# Patient Record
Sex: Male | Born: 1975 | Race: White | Hispanic: No | Marital: Married | Smoking: Former smoker
Health system: Southern US, Community
[De-identification: ages and names within clinical notes are randomized; demographics above are authoritative.]

## PROBLEM LIST (undated history)

## (undated) DIAGNOSIS — F419 Anxiety disorder, unspecified: Secondary | ICD-10-CM

## (undated) DIAGNOSIS — R112 Nausea with vomiting, unspecified: Secondary | ICD-10-CM

## (undated) DIAGNOSIS — N200 Calculus of kidney: Secondary | ICD-10-CM

## (undated) DIAGNOSIS — Z972 Presence of dental prosthetic device (complete) (partial): Secondary | ICD-10-CM

## (undated) DIAGNOSIS — E7800 Pure hypercholesterolemia, unspecified: Secondary | ICD-10-CM

## (undated) DIAGNOSIS — Z9889 Other specified postprocedural states: Secondary | ICD-10-CM

## (undated) DIAGNOSIS — I2510 Atherosclerotic heart disease of native coronary artery without angina pectoris: Secondary | ICD-10-CM

## (undated) DIAGNOSIS — R29898 Other symptoms and signs involving the musculoskeletal system: Secondary | ICD-10-CM

## (undated) DIAGNOSIS — Z9689 Presence of other specified functional implants: Secondary | ICD-10-CM

## (undated) DIAGNOSIS — G8929 Other chronic pain: Secondary | ICD-10-CM

## (undated) DIAGNOSIS — K219 Gastro-esophageal reflux disease without esophagitis: Secondary | ICD-10-CM

## (undated) DIAGNOSIS — B3781 Candidal esophagitis: Secondary | ICD-10-CM

## (undated) DIAGNOSIS — F32A Depression, unspecified: Secondary | ICD-10-CM

## (undated) DIAGNOSIS — G4730 Sleep apnea, unspecified: Secondary | ICD-10-CM

## (undated) DIAGNOSIS — J449 Chronic obstructive pulmonary disease, unspecified: Secondary | ICD-10-CM

## (undated) DIAGNOSIS — Z87442 Personal history of urinary calculi: Secondary | ICD-10-CM

## (undated) DIAGNOSIS — I219 Acute myocardial infarction, unspecified: Secondary | ICD-10-CM

## (undated) DIAGNOSIS — M1990 Unspecified osteoarthritis, unspecified site: Secondary | ICD-10-CM

## (undated) DIAGNOSIS — M778 Other enthesopathies, not elsewhere classified: Secondary | ICD-10-CM

## (undated) DIAGNOSIS — S63642A Sprain of metacarpophalangeal joint of left thumb, initial encounter: Secondary | ICD-10-CM

## (undated) DIAGNOSIS — F329 Major depressive disorder, single episode, unspecified: Secondary | ICD-10-CM

## (undated) DIAGNOSIS — R51 Headache: Secondary | ICD-10-CM

## (undated) DIAGNOSIS — F4310 Post-traumatic stress disorder, unspecified: Secondary | ICD-10-CM

## (undated) DIAGNOSIS — R519 Headache, unspecified: Secondary | ICD-10-CM

## (undated) HISTORY — PX: OTHER SURGICAL HISTORY: SHX169

## (undated) HISTORY — PX: CORONARY ANGIOPLASTY: SHX604

## (undated) HISTORY — PX: BACK SURGERY: SHX140

## (undated) HISTORY — PX: SPINAL CORD STIMULATOR IMPLANT: SHX2422

## (undated) HISTORY — DX: Calculus of kidney: N20.0

---

## 1996-06-04 HISTORY — PX: CYST EXCISION: SHX5701

## 1998-10-04 ENCOUNTER — Encounter: Payer: Self-pay | Admitting: Neurosurgery

## 1998-10-04 ENCOUNTER — Ambulatory Visit (HOSPITAL_COMMUNITY): Admission: RE | Admit: 1998-10-04 | Discharge: 1998-10-05 | Payer: Self-pay | Admitting: Neurosurgery

## 2000-01-08 ENCOUNTER — Encounter: Payer: Self-pay | Admitting: Neurosurgery

## 2000-01-08 ENCOUNTER — Encounter: Admission: RE | Admit: 2000-01-08 | Discharge: 2000-01-08 | Payer: Self-pay | Admitting: Neurosurgery

## 2000-06-04 DIAGNOSIS — Z87442 Personal history of urinary calculi: Secondary | ICD-10-CM

## 2000-06-04 HISTORY — DX: Personal history of urinary calculi: Z87.442

## 2001-06-04 DIAGNOSIS — Z9889 Other specified postprocedural states: Secondary | ICD-10-CM

## 2001-06-04 DIAGNOSIS — R112 Nausea with vomiting, unspecified: Secondary | ICD-10-CM

## 2001-06-04 HISTORY — DX: Other specified postprocedural states: R11.2

## 2001-06-04 HISTORY — DX: Other specified postprocedural states: Z98.890

## 2001-12-15 ENCOUNTER — Encounter: Admission: RE | Admit: 2001-12-15 | Discharge: 2001-12-15 | Payer: Self-pay | Admitting: Neurosurgery

## 2001-12-15 ENCOUNTER — Encounter: Payer: Self-pay | Admitting: Neurosurgery

## 2001-12-29 ENCOUNTER — Encounter: Payer: Self-pay | Admitting: Neurosurgery

## 2001-12-29 ENCOUNTER — Encounter: Admission: RE | Admit: 2001-12-29 | Discharge: 2001-12-29 | Payer: Self-pay | Admitting: Neurosurgery

## 2002-01-12 ENCOUNTER — Encounter: Admission: RE | Admit: 2002-01-12 | Discharge: 2002-01-12 | Payer: Self-pay | Admitting: Neurosurgery

## 2002-01-12 ENCOUNTER — Encounter: Payer: Self-pay | Admitting: Neurosurgery

## 2002-02-12 ENCOUNTER — Encounter: Payer: Self-pay | Admitting: Neurosurgery

## 2002-02-12 ENCOUNTER — Inpatient Hospital Stay (HOSPITAL_COMMUNITY): Admission: RE | Admit: 2002-02-12 | Discharge: 2002-02-16 | Payer: Self-pay | Admitting: Neurosurgery

## 2003-06-05 HISTORY — PX: LUMBAR NERVE STIMLATOR INSERTION: SHX1983

## 2003-08-04 ENCOUNTER — Ambulatory Visit (HOSPITAL_COMMUNITY): Admission: RE | Admit: 2003-08-04 | Discharge: 2003-08-04 | Payer: Self-pay | Admitting: Neurosurgery

## 2006-01-16 DIAGNOSIS — F332 Major depressive disorder, recurrent severe without psychotic features: Secondary | ICD-10-CM | POA: Insufficient documentation

## 2006-01-16 DIAGNOSIS — F321 Major depressive disorder, single episode, moderate: Secondary | ICD-10-CM

## 2006-01-16 HISTORY — DX: Major depressive disorder, single episode, moderate: F32.1

## 2006-07-01 DIAGNOSIS — F4310 Post-traumatic stress disorder, unspecified: Secondary | ICD-10-CM | POA: Insufficient documentation

## 2006-10-23 ENCOUNTER — Ambulatory Visit: Payer: Self-pay | Admitting: Pain Medicine

## 2006-10-30 ENCOUNTER — Ambulatory Visit: Payer: Self-pay | Admitting: Pain Medicine

## 2006-12-13 ENCOUNTER — Ambulatory Visit: Payer: Self-pay | Admitting: Unknown Physician Specialty

## 2008-03-20 ENCOUNTER — Ambulatory Visit: Payer: Self-pay | Admitting: Internal Medicine

## 2008-06-04 HISTORY — PX: VASECTOMY: SHX75

## 2009-03-13 ENCOUNTER — Ambulatory Visit: Payer: Self-pay | Admitting: Family Medicine

## 2009-06-04 DIAGNOSIS — F4310 Post-traumatic stress disorder, unspecified: Secondary | ICD-10-CM

## 2009-06-04 HISTORY — DX: Post-traumatic stress disorder, unspecified: F43.10

## 2009-10-26 ENCOUNTER — Ambulatory Visit: Payer: Self-pay | Admitting: Nurse Practitioner

## 2010-01-08 ENCOUNTER — Ambulatory Visit: Payer: Self-pay | Admitting: Internal Medicine

## 2010-06-04 HISTORY — PX: SHOULDER ARTHROSCOPY WITH ROTATOR CUFF REPAIR: SHX5685

## 2010-06-23 ENCOUNTER — Ambulatory Visit: Payer: Self-pay | Admitting: Internal Medicine

## 2010-06-25 ENCOUNTER — Ambulatory Visit: Payer: Self-pay | Admitting: Family Medicine

## 2010-09-10 ENCOUNTER — Ambulatory Visit: Payer: Self-pay | Admitting: Family Medicine

## 2010-09-12 ENCOUNTER — Ambulatory Visit: Payer: Self-pay | Admitting: Family Medicine

## 2010-09-15 ENCOUNTER — Ambulatory Visit: Payer: Self-pay | Admitting: Family Medicine

## 2011-05-22 ENCOUNTER — Inpatient Hospital Stay: Payer: Self-pay | Admitting: Psychiatry

## 2011-07-20 DIAGNOSIS — F063 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, unspecified: Secondary | ICD-10-CM | POA: Insufficient documentation

## 2012-06-04 HISTORY — PX: CARDIAC CATHETERIZATION: SHX172

## 2013-04-04 DIAGNOSIS — I219 Acute myocardial infarction, unspecified: Secondary | ICD-10-CM

## 2013-04-04 HISTORY — DX: Acute myocardial infarction, unspecified: I21.9

## 2013-07-22 DIAGNOSIS — E7800 Pure hypercholesterolemia, unspecified: Secondary | ICD-10-CM | POA: Insufficient documentation

## 2013-07-22 DIAGNOSIS — E7849 Other hyperlipidemia: Secondary | ICD-10-CM | POA: Insufficient documentation

## 2013-11-06 DIAGNOSIS — J449 Chronic obstructive pulmonary disease, unspecified: Secondary | ICD-10-CM | POA: Insufficient documentation

## 2014-06-04 HISTORY — PX: HEMATOMA EVACUATION: SHX5118
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
## 2015-05-18 DIAGNOSIS — E7143 Iatrogenic carnitine deficiency: Secondary | ICD-10-CM | POA: Insufficient documentation

## 2015-05-18 DIAGNOSIS — T50905A Adverse effect of unspecified drugs, medicaments and biological substances, initial encounter: Secondary | ICD-10-CM | POA: Insufficient documentation

## 2017-02-14 ENCOUNTER — Other Ambulatory Visit: Payer: Self-pay | Admitting: Orthopedic Surgery

## 2017-02-14 DIAGNOSIS — M25521 Pain in right elbow: Principal | ICD-10-CM

## 2017-02-14 DIAGNOSIS — G8929 Other chronic pain: Secondary | ICD-10-CM

## 2017-02-14 DIAGNOSIS — M7711 Lateral epicondylitis, right elbow: Secondary | ICD-10-CM

## 2017-02-28 ENCOUNTER — Ambulatory Visit
Admission: RE | Admit: 2017-02-28 | Discharge: 2017-02-28 | Disposition: A | Discharge status: Home / Self Care | Payer: Medicare Other | Source: Ambulatory Visit | Attending: Orthopedic Surgery | Admitting: Orthopedic Surgery

## 2017-02-28 ENCOUNTER — Other Ambulatory Visit: Payer: Self-pay | Admitting: Orthopedic Surgery

## 2017-02-28 DIAGNOSIS — G8929 Other chronic pain: Secondary | ICD-10-CM | POA: Diagnosis present

## 2017-02-28 DIAGNOSIS — M25521 Pain in right elbow: Secondary | ICD-10-CM | POA: Diagnosis present

## 2017-02-28 DIAGNOSIS — Z9889 Other specified postprocedural states: Secondary | ICD-10-CM

## 2017-02-28 DIAGNOSIS — M7711 Lateral epicondylitis, right elbow: Secondary | ICD-10-CM | POA: Diagnosis not present

## 2017-02-28 DIAGNOSIS — M19011 Primary osteoarthritis, right shoulder: Secondary | ICD-10-CM | POA: Diagnosis not present

## 2017-03-13 DIAGNOSIS — E669 Obesity, unspecified: Secondary | ICD-10-CM | POA: Insufficient documentation

## 2017-04-23 ENCOUNTER — Other Ambulatory Visit: Payer: Self-pay

## 2017-04-23 ENCOUNTER — Encounter
Admission: RE | Admit: 2017-04-23 | Discharge: 2017-04-23 | Disposition: A | Discharge status: Home / Self Care | Payer: Medicare Other | Source: Ambulatory Visit | Attending: Unknown Physician Specialty | Admitting: Unknown Physician Specialty

## 2017-04-23 DIAGNOSIS — Z01812 Encounter for preprocedural laboratory examination: Secondary | ICD-10-CM | POA: Insufficient documentation

## 2017-04-23 DIAGNOSIS — I2510 Atherosclerotic heart disease of native coronary artery without angina pectoris: Secondary | ICD-10-CM | POA: Diagnosis not present

## 2017-04-23 DIAGNOSIS — I252 Old myocardial infarction: Secondary | ICD-10-CM | POA: Diagnosis not present

## 2017-04-23 DIAGNOSIS — Z01810 Encounter for preprocedural cardiovascular examination: Secondary | ICD-10-CM | POA: Insufficient documentation

## 2017-04-23 HISTORY — DX: Major depressive disorder, single episode, unspecified: F32.9

## 2017-04-23 HISTORY — DX: Other specified postprocedural states: Z98.890

## 2017-04-23 HISTORY — DX: Nausea with vomiting, unspecified: R11.2

## 2017-04-23 HISTORY — DX: Personal history of urinary calculi: Z87.442

## 2017-04-23 HISTORY — DX: Headache: R51

## 2017-04-23 HISTORY — DX: Post-traumatic stress disorder, unspecified: F43.10

## 2017-04-23 HISTORY — DX: Anxiety disorder, unspecified: F41.9

## 2017-04-23 HISTORY — DX: Atherosclerotic heart disease of native coronary artery without angina pectoris: I25.10

## 2017-04-23 HISTORY — DX: Chronic obstructive pulmonary disease, unspecified: J44.9

## 2017-04-23 HISTORY — DX: Acute myocardial infarction, unspecified: I21.9

## 2017-04-23 HISTORY — DX: Headache, unspecified: R51.9

## 2017-04-23 HISTORY — DX: Gastro-esophageal reflux disease without esophagitis: K21.9

## 2017-04-23 HISTORY — DX: Sleep apnea, unspecified: G47.30

## 2017-04-23 HISTORY — DX: Depression, unspecified: F32.A

## 2017-04-23 LAB — CBC
HCT: 38.7 % — ABNORMAL LOW (ref 40.0–52.0)
Hemoglobin: 13 g/dL (ref 13.0–18.0)
MCH: 29.7 pg (ref 26.0–34.0)
MCHC: 33.6 g/dL (ref 32.0–36.0)
MCV: 88.5 fL (ref 80.0–100.0)
PLATELETS: 145 10*3/uL — AB (ref 150–440)
RBC: 4.37 MIL/uL — ABNORMAL LOW (ref 4.40–5.90)
RDW: 14.1 % (ref 11.5–14.5)
WBC: 4.1 10*3/uL (ref 3.8–10.6)

## 2017-04-23 NOTE — Patient Instructions (Signed)
  Your procedure is scheduled on:Wednesday Nov. 28 , 2018. Report to Same Day Surgery. To find out your arrival time please call [phone] between 1PM - 3PM on Tuesday Nov. 27, 2018 .  Remember: Instructions that are not followed completely may result in serious medical risk, up to and including death, or upon the discretion of your surgeon and anesthesiologist your surgery may need to be rescheduled.    _x___ 1. Do not eat food after midnight night prior to surgery. No gum   chewing or hard candies, snacks or breakfast.    May drink the following: water, Gatorade, clear apple juice, black coffee     or black tea up until 2 hours prior to ARRIVAL time.     ____ 2. No Alcohol for 24 hours before or after surgery.   ____ 3. Bring all medications with you on the day of surgery if instructed.    __x__ 4. Notify your doctor if there is any change in your medical condition     (cold, fever, infections).    _____ 5.   Do Not Smoke or use e-cigarettes For 24 Hours Prior to Your   Surgery.  Do not use any chewable tobacco products for at least 6   hours prior to  surgery.                      Do not wear jewelry, make-up, hairpins, clips or nail polish.  Do not wear lotions, powders, or perfumes.   Do not shave 48 hours prior to surgery. Men may shave face and neck.  Do not bring valuables to the hospital.    [facility] is not responsible for any belongings or valuables.               Contacts, dentures or bridgework may not be worn into surgery.  Leave your suitcase in the car. After surgery it may be brought to your room.  For patients admitted to the hospital, discharge time is determined by your  treatment team.   Patients discharged the day of surgery will not be allowed to drive home.    Please read over the following fact sheets that you were given:   [facility] Preparing for Surgery  _x___ Take these medicines the morning of surgery with A SIP OF WATER:    1. buPROPion  (WELLBUTRIN XL)  2. LYRICA   3. oxyCODONE (OXY IR/ROXICODONE) optional  4. pantoprazole (PROTONIX) take an extra dose at bedtime the night prior to surgery.      ____ Fleet Enema (as directed)   __x__ Use CHG Soap as directed on instruction sheet  __x__ Use inhalers on the day of surgery and bring to hospital day of surgery  ____ Stop metformin 2 days prior to surgery    ____ Take 1/2 of usual insulin dose the night before surgery and none on the morning of          surgery.   __x__ Stop aspirin as instructed by PCP.  __x__ Stop Anti-inflammatories such as Advil, Aleve, Ibuprofen, Motrin, Naproxen,  Naprosyn, Goodies powders or aspirin products. OK to take Tylenol and oxyCODONE (OXY IR/ROXICODONE)   .   ____ Stop supplements until after surgery.    __x__ Bring Bi-Pap to the hospital.

## 2017-04-23 NOTE — Pre-Procedure Instructions (Signed)
Medical clearance on front of Pt's chart. EKG= NSR 04/23/2017.

## 2017-05-01 ENCOUNTER — Encounter
Admission: RE | Disposition: A | Discharge status: Home / Self Care | Payer: Self-pay | Source: Ambulatory Visit | Attending: Unknown Physician Specialty

## 2017-05-01 ENCOUNTER — Ambulatory Visit
Admission: RE | Admit: 2017-05-01 | Discharge: 2017-05-01 | Disposition: A | Discharge status: Home / Self Care | Payer: Medicare Other | Source: Ambulatory Visit | Attending: Unknown Physician Specialty | Admitting: Unknown Physician Specialty

## 2017-05-01 ENCOUNTER — Ambulatory Visit: Payer: Medicare Other | Admitting: Anesthesiology

## 2017-05-01 ENCOUNTER — Encounter: Payer: Self-pay | Admitting: Anesthesiology

## 2017-05-01 ENCOUNTER — Other Ambulatory Visit: Payer: Self-pay

## 2017-05-01 DIAGNOSIS — I2510 Atherosclerotic heart disease of native coronary artery without angina pectoris: Secondary | ICD-10-CM | POA: Diagnosis not present

## 2017-05-01 DIAGNOSIS — K219 Gastro-esophageal reflux disease without esophagitis: Secondary | ICD-10-CM | POA: Diagnosis not present

## 2017-05-01 DIAGNOSIS — Z955 Presence of coronary angioplasty implant and graft: Secondary | ICD-10-CM | POA: Diagnosis not present

## 2017-05-01 DIAGNOSIS — G4730 Sleep apnea, unspecified: Secondary | ICD-10-CM | POA: Diagnosis not present

## 2017-05-01 DIAGNOSIS — Z87891 Personal history of nicotine dependence: Secondary | ICD-10-CM | POA: Diagnosis not present

## 2017-05-01 DIAGNOSIS — E669 Obesity, unspecified: Secondary | ICD-10-CM | POA: Diagnosis not present

## 2017-05-01 DIAGNOSIS — I252 Old myocardial infarction: Secondary | ICD-10-CM | POA: Diagnosis not present

## 2017-05-01 DIAGNOSIS — M7711 Lateral epicondylitis, right elbow: Secondary | ICD-10-CM | POA: Insufficient documentation

## 2017-05-01 DIAGNOSIS — J449 Chronic obstructive pulmonary disease, unspecified: Secondary | ICD-10-CM | POA: Insufficient documentation

## 2017-05-01 HISTORY — PX: TENNIS ELBOW RELEASE/NIRSCHEL PROCEDURE: SHX6651

## 2017-05-01 SURGERY — TENNIS ELBOW RELEASE/NIRSCHEL PROCEDURE
Anesthesia: General | Site: Elbow | Laterality: Right | Wound class: Clean

## 2017-05-01 MED ORDER — KETOROLAC TROMETHAMINE 30 MG/ML IJ SOLN
INTRAMUSCULAR | Status: DC | PRN
  Administered 2017-05-01: 30 mg via INTRAVENOUS

## 2017-05-01 MED ORDER — KETOROLAC TROMETHAMINE 30 MG/ML IJ SOLN
INTRAMUSCULAR | Status: AC
  Filled 2017-05-01: qty 1

## 2017-05-01 MED ORDER — ONDANSETRON HCL 4 MG/2ML IJ SOLN
INTRAMUSCULAR | Status: AC
  Filled 2017-05-01: qty 2

## 2017-05-01 MED ORDER — FENTANYL CITRATE (PF) 100 MCG/2ML IJ SOLN
INTRAMUSCULAR | Status: DC | PRN
  Administered 2017-05-01 (×5): 25 ug via INTRAVENOUS
  Administered 2017-05-01: 50 ug via INTRAVENOUS
  Administered 2017-05-01: 25 ug via INTRAVENOUS

## 2017-05-01 MED ORDER — GLYCOPYRROLATE 0.2 MG/ML IJ SOLN
INTRAMUSCULAR | Status: DC | PRN
  Administered 2017-05-01: 0.2 mg via INTRAVENOUS

## 2017-05-01 MED ORDER — FENTANYL CITRATE (PF) 100 MCG/2ML IJ SOLN
25.0000 ug | INTRAMUSCULAR | Status: AC | PRN
  Administered 2017-05-01 (×6): 25 ug via INTRAVENOUS

## 2017-05-01 MED ORDER — FENTANYL CITRATE (PF) 100 MCG/2ML IJ SOLN
INTRAMUSCULAR | Status: AC
  Administered 2017-05-01: 25 ug via INTRAVENOUS
  Filled 2017-05-01: qty 2

## 2017-05-01 MED ORDER — BUPIVACAINE HCL (PF) 0.5 % IJ SOLN
INTRAMUSCULAR | Status: DC | PRN
  Administered 2017-05-01: 10 mL

## 2017-05-01 MED ORDER — DEXAMETHASONE SODIUM PHOSPHATE 10 MG/ML IJ SOLN
INTRAMUSCULAR | Status: AC
  Filled 2017-05-01: qty 1

## 2017-05-01 MED ORDER — FENTANYL CITRATE (PF) 100 MCG/2ML IJ SOLN
INTRAMUSCULAR | Status: AC
  Filled 2017-05-01: qty 2

## 2017-05-01 MED ORDER — DEXMEDETOMIDINE HCL IN NACL 80 MCG/20ML IV SOLN
INTRAVENOUS | Status: AC
  Filled 2017-05-01: qty 20

## 2017-05-01 MED ORDER — DEXMEDETOMIDINE HCL 200 MCG/2ML IV SOLN
INTRAVENOUS | Status: DC | PRN
  Administered 2017-05-01: 8 ug via INTRAVENOUS
  Administered 2017-05-01: 4 ug via INTRAVENOUS
  Administered 2017-05-01: 8 ug via INTRAVENOUS

## 2017-05-01 MED ORDER — PROPOFOL 10 MG/ML IV BOLUS
INTRAVENOUS | Status: AC
  Filled 2017-05-01: qty 20

## 2017-05-01 MED ORDER — MIDAZOLAM HCL 2 MG/2ML IJ SOLN
INTRAMUSCULAR | Status: AC
  Filled 2017-05-01: qty 2

## 2017-05-01 MED ORDER — LACTATED RINGERS IV SOLN
INTRAVENOUS | Status: DC
  Administered 2017-05-01: 09:00:00 via INTRAVENOUS

## 2017-05-01 MED ORDER — HYDROMORPHONE HCL 1 MG/ML IJ SOLN
INTRAMUSCULAR | Status: AC
  Administered 2017-05-01: 0.5 mg via INTRAVENOUS
  Filled 2017-05-01: qty 1

## 2017-05-01 MED ORDER — MIDAZOLAM HCL 2 MG/2ML IJ SOLN
INTRAMUSCULAR | Status: DC | PRN
  Administered 2017-05-01: 2 mg via INTRAVENOUS

## 2017-05-01 MED ORDER — ONDANSETRON HCL 4 MG/2ML IJ SOLN
INTRAMUSCULAR | Status: DC | PRN
  Administered 2017-05-01: 4 mg via INTRAVENOUS

## 2017-05-01 MED ORDER — BUPIVACAINE HCL (PF) 0.5 % IJ SOLN
INTRAMUSCULAR | Status: AC
  Filled 2017-05-01: qty 30

## 2017-05-01 MED ORDER — LIDOCAINE HCL (PF) 2 % IJ SOLN
INTRAMUSCULAR | Status: AC
  Filled 2017-05-01: qty 10

## 2017-05-01 MED ORDER — GLYCOPYRROLATE 0.2 MG/ML IJ SOLN
INTRAMUSCULAR | Status: AC
  Filled 2017-05-01: qty 1

## 2017-05-01 MED ORDER — HYDROMORPHONE HCL 1 MG/ML IJ SOLN
0.2500 mg | INTRAMUSCULAR | Status: DC | PRN
  Administered 2017-05-01 (×2): 0.5 mg via INTRAVENOUS

## 2017-05-01 MED ORDER — PROPOFOL 10 MG/ML IV BOLUS
INTRAVENOUS | Status: DC | PRN
  Administered 2017-05-01: 200 mg via INTRAVENOUS

## 2017-05-01 MED ORDER — LIDOCAINE HCL (CARDIAC) 20 MG/ML IV SOLN
INTRAVENOUS | Status: DC | PRN
  Administered 2017-05-01: 80 mg via INTRAVENOUS

## 2017-05-01 MED ORDER — OXYCODONE HCL 5 MG PO TABS
ORAL_TABLET | ORAL | Status: AC
  Administered 2017-05-01: 5 mg
  Filled 2017-05-01: qty 1

## 2017-05-01 MED ORDER — ONDANSETRON HCL 4 MG/2ML IJ SOLN: 4.0000 mg | Freq: Once | INTRAMUSCULAR | Status: DC | PRN

## 2017-05-01 MED ORDER — DEXAMETHASONE SODIUM PHOSPHATE 10 MG/ML IJ SOLN
INTRAMUSCULAR | Status: DC | PRN
  Administered 2017-05-01: 10 mg via INTRAVENOUS

## 2017-05-01 SURGICAL SUPPLY — 43 items
BANDAGE ELASTIC 4 LF NS (GAUZE/BANDAGES/DRESSINGS) ×6 IMPLANT
BLADE SURG SZ10 CARB STEEL (BLADE) ×3 IMPLANT
BNDG COHESIVE 4X5 TAN STRL (GAUZE/BANDAGES/DRESSINGS) ×3 IMPLANT
BNDG ESMARK 4X12 TAN STRL LF (GAUZE/BANDAGES/DRESSINGS) ×3 IMPLANT
BUR 4X45 EGG (BURR) ×3 IMPLANT
BUR 4X55 1 (BURR) ×3 IMPLANT
CANISTER SUCT 1200ML W/VALVE (MISCELLANEOUS) ×3 IMPLANT
CUFF DUAL TOURNIQUET 18IN DISP (TOURNIQUET CUFF) ×3 IMPLANT
CUFF TOURN 24 STER (MISCELLANEOUS) IMPLANT
DRAPE INCISE IOBAN 66X45 STRL (DRAPES) ×3 IMPLANT
DRILL BIT ×3 IMPLANT
DURAPREP 26ML APPLICATOR (WOUND CARE) ×3 IMPLANT
ELECT REM PT RETURN 9FT ADLT (ELECTROSURGICAL) ×3
ELECTRODE REM PT RTRN 9FT ADLT (ELECTROSURGICAL) ×1 IMPLANT
GAUZE SPONGE 4X4 12PLY STRL (GAUZE/BANDAGES/DRESSINGS) ×6 IMPLANT
GLOVE BIO SURGEON STRL SZ8 (GLOVE) ×6 IMPLANT
GLOVE INDICATOR 8.0 STRL GRN (GLOVE) ×3 IMPLANT
GOWN STRL REUS W/ TWL LRG LVL3 (GOWN DISPOSABLE) ×2 IMPLANT
GOWN STRL REUS W/TWL LRG LVL3 (GOWN DISPOSABLE) ×4
KIT RM TURNOVER STRD PROC AR (KITS) ×3 IMPLANT
NS IRRIG 500ML POUR BTL (IV SOLUTION) ×3 IMPLANT
PACK EXTREMITY ARMC (MISCELLANEOUS) ×3 IMPLANT
PAD CAST CTTN 4X4 STRL (SOFTGOODS) ×2 IMPLANT
PADDING CAST 3IN STRL (MISCELLANEOUS) ×4
PADDING CAST BLEND 3X4 STRL (MISCELLANEOUS) ×2 IMPLANT
PADDING CAST COTTON 4X4 STRL (SOFTGOODS) ×4
SLING ARM LRG DEEP (SOFTGOODS) ×3 IMPLANT
SOL PREP PVP 2OZ (MISCELLANEOUS) ×3
SOLUTION PREP PVP 2OZ (MISCELLANEOUS) ×1 IMPLANT
SPLINT CAST 1 STEP 5X30 WHT (MISCELLANEOUS) ×3 IMPLANT
SPONGE LAP 18X18 5 PK (GAUZE/BANDAGES/DRESSINGS) IMPLANT
STAPLER SKIN PROX 35W (STAPLE) ×3 IMPLANT
STOCKINETTE IMPERV 14X48 (MISCELLANEOUS) ×3 IMPLANT
SUT ETHILON 3-0 FS-10 30 BLK (SUTURE) ×3
SUT VIC AB 0 CT1 27 (SUTURE)
SUT VIC AB 0 CT1 27XCR 8 STRN (SUTURE) IMPLANT
SUT VIC AB 2-0 SH 27 (SUTURE) ×2
SUT VIC AB 2-0 SH 27XBRD (SUTURE) ×1 IMPLANT
SUT VIC AB 4-0 SH 27 (SUTURE)
SUT VIC AB 4-0 SH 27XANBCTRL (SUTURE) IMPLANT
SUTURE EHLN 3-0 FS-10 30 BLK (SUTURE) ×1 IMPLANT
SYR 10ML LL (SYRINGE) ×3 IMPLANT
TAPE TRANSPORE STRL 2 31045 (GAUZE/BANDAGES/DRESSINGS) ×3 IMPLANT

## 2017-05-01 NOTE — Progress Notes (Signed)
Ice pack to right extremity

## 2017-05-01 NOTE — Discharge Instructions (Signed)
Ice pack ° °Elevation ° °RTC in about 2 weeks °

## 2017-05-01 NOTE — Progress Notes (Signed)
Can wiggle fingers on right   Capillary refill positive to right hand

## 2017-05-01 NOTE — Transfer of Care (Signed)
Immediate Anesthesia Transfer of Care Note  Patient: Jeffery ChafeJohnny N Hubbard  Procedure(s) Performed: TENNIS ELBOW RELEASE/NIRSCHEL PROCEDURE (Right Elbow)  Patient Location: PACU  Anesthesia Type:General  Level of Consciousness: awake, alert  and patient cooperative  Airway & Oxygen Therapy: Patient Spontanous Breathing and Patient connected to nasal cannula oxygen  Post-op Assessment: Report given to RN and Post -op Vital signs reviewed and stable  Post vital signs: Reviewed and stable  Last Vitals:  Vitals:   05/01/17 0816  BP: 134/89  Pulse: 83  Resp: 16  Temp: 36.7 C  SpO2: 98%    Last Pain:  Vitals:   05/01/17 0816  TempSrc: Oral  PainSc: 6          Complications: No apparent anesthesia complications

## 2017-05-01 NOTE — Anesthesia Postprocedure Evaluation (Signed)
Anesthesia Post Note  Patient: Jeffery Hubbard  Procedure(s) Performed: TENNIS ELBOW RELEASE/NIRSCHEL PROCEDURE (Right Elbow)  Patient location during evaluation: PACU Anesthesia Type: General Level of consciousness: awake and alert Pain management: pain level controlled Vital Signs Assessment: post-procedure vital signs reviewed and stable Respiratory status: spontaneous breathing, nonlabored ventilation, respiratory function stable and patient connected to nasal cannula oxygen Cardiovascular status: blood pressure returned to baseline and stable Postop Assessment: no apparent nausea or vomiting Anesthetic complications: no     Last Vitals:  Vitals:   05/01/17 1140 05/01/17 1220  BP: 112/62 116/65  Pulse: 83 81  Resp: 18   Temp: (!) 36.2 C   SpO2: 93% 94%    Last Pain:  Vitals:   05/01/17 1220  TempSrc:   PainSc: 3                  Julianah Marciel S

## 2017-05-01 NOTE — Progress Notes (Signed)
States pain is about the same but wants to go home

## 2017-05-01 NOTE — Anesthesia Preprocedure Evaluation (Addendum)
Anesthesia Evaluation  Patient identified by MRN, date of birth, ID band Patient awake    Reviewed: Allergy & Precautions, NPO status , Patient's Chart, lab work & pertinent test results, reviewed documented beta blocker date and time   History of Anesthesia Complications (+) PONV and history of anesthetic complications  Airway Mallampati: III  TM Distance: >3 FB     Dental  (+) Chipped   Pulmonary sleep apnea , COPD, former smoker,           Cardiovascular + CAD, + Past MI and + Cardiac Stents       Neuro/Psych    GI/Hepatic GERD  Controlled,  Endo/Other    Renal/GU      Musculoskeletal   Abdominal   Peds  Hematology   Anesthesia Other Findings Uses Bi PAP.PTSD.Obese.  Reproductive/Obstetrics                            Anesthesia Physical Anesthesia Plan  ASA: III  Anesthesia Plan: General   Post-op Pain Management:    Induction: Intravenous  PONV Risk Score and Plan:   Airway Management Planned: LMA  Additional Equipment:   Intra-op Plan:   Post-operative Plan:   Informed Consent: I have reviewed the patients History and Physical, chart, labs and discussed the procedure including the risks, benefits and alternatives for the proposed anesthesia with the patient or authorized representative who has indicated his/her understanding and acceptance.     Plan Discussed with: CRNA  Anesthesia Plan Comments:         Anesthesia Quick Evaluation

## 2017-05-01 NOTE — Anesthesia Post-op Follow-up Note (Signed)
Anesthesia QCDR form completed.        

## 2017-05-01 NOTE — Progress Notes (Signed)
Spoke with Dr. Maisie Fushomas no UDS needed.

## 2017-05-01 NOTE — H&P (Signed)
  H and P reviewed. No changes. Uploaded at later date. 

## 2017-05-01 NOTE — Op Note (Signed)
Expand All Collapse All       PATIENT: Jeffery Hubbard, [name]  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  The patient had conservative treatment in the form of lateral epicondylar steroid injections.  These had not completely relieved the patient's symptoms. Consequently the patient was brought in for a Nirschl procedure on the right elbow.  OP NOTE: The patient was taken to the operating room where a tourniquet was applied to her right upper arm. Satisfactory general anesthesia was achieved. The right upper extremity was prepped and draped in usual fashion for procedure about the elbow. Right upper extremity was then exsanguinated and the tourniquet was inflated. A longitudinal incision was made over the lateral epicondyle and the radial head.The interval between the extensor carpi radialis longus and the common extensor was opened up. I reflected their tendinous attachments off the lateral epicondyle. I then excised the scarred lateral epicondylar attachment of the extensor carpi radialis brevis in a V-shaped fashion. The radiohumeral joint was inspected. There was some synovial fluid in the joint. No chondral lesion was appreciated. Next I used a 3 mm burr to lightly decorticate the lateral epicondyle. I then drilled multiple 2 mm holes in the lateral epicondyle. The wound was irrigated with saline. I then closed the interval between the extensor carpi radialis longus and the common extensor with 0 Vicryl sutures. The tourniquet was released. It was up about 35 minutes. Bleeding was controlled with coagulation cautery. I infiltrated the subcutaneous tissue  with 10 cc of half percent Marcaine without epinephrine. The subcutaneous tissue was then closed with 2-0 Vicryl and skin with skin staples. Betadine was applied the wound followed by a sterile dressing and a fiberglass posterior splint with the elbow flexed about 90.  The patient was then awakened and transferred to a stretcher bed.  The patient was then taken to the recovery room in satisfactory condition. Blood loss was negligible.

## 2017-05-01 NOTE — Anesthesia Procedure Notes (Signed)
Procedure Name: LMA Insertion Date/Time: 05/01/2017 9:01 AM Performed by: Dava NajjarFrazier, Luciana Cammarata, CRNA Pre-anesthesia Checklist: Patient identified, Emergency Drugs available, Suction available, Patient being monitored and Timeout performed Patient Re-evaluated:Patient Re-evaluated prior to induction Oxygen Delivery Method: Circle system utilized Preoxygenation: Pre-oxygenation with 100% oxygen Induction Type: IV induction LMA: LMA inserted LMA Size: 4.0 Number of attempts: 1 Placement Confirmation: positive ETCO2 and breath sounds checked- equal and bilateral Tube secured with: Tape Dental Injury: Teeth and Oropharynx as per pre-operative assessment

## 2017-05-03 ENCOUNTER — Encounter: Payer: Self-pay | Admitting: Unknown Physician Specialty

## 2018-04-15 ENCOUNTER — Other Ambulatory Visit: Payer: Self-pay | Admitting: Orthopedic Surgery

## 2018-04-15 DIAGNOSIS — M25512 Pain in left shoulder: Principal | ICD-10-CM

## 2018-04-15 DIAGNOSIS — G8929 Other chronic pain: Secondary | ICD-10-CM

## 2018-05-02 ENCOUNTER — Ambulatory Visit
Admission: RE | Admit: 2018-05-02 | Discharge: 2018-05-02 | Disposition: A | Discharge status: Home / Self Care | Payer: Medicare Other | Source: Ambulatory Visit | Attending: Orthopedic Surgery | Admitting: Orthopedic Surgery

## 2018-05-02 DIAGNOSIS — M19012 Primary osteoarthritis, left shoulder: Secondary | ICD-10-CM | POA: Insufficient documentation

## 2018-05-02 DIAGNOSIS — M25512 Pain in left shoulder: Secondary | ICD-10-CM | POA: Diagnosis present

## 2018-05-02 DIAGNOSIS — G8929 Other chronic pain: Secondary | ICD-10-CM | POA: Diagnosis present

## 2018-06-03 ENCOUNTER — Encounter: Payer: Self-pay | Admitting: *Deleted

## 2018-06-03 ENCOUNTER — Other Ambulatory Visit: Payer: Self-pay

## 2018-06-10 ENCOUNTER — Ambulatory Visit
Admission: RE | Admit: 2018-06-10 | Discharge: 2018-06-10 | Disposition: A | Discharge status: Home / Self Care | Payer: Medicare Other | Attending: Orthopedic Surgery | Admitting: Orthopedic Surgery

## 2018-06-10 ENCOUNTER — Ambulatory Visit: Payer: Medicare Other | Admitting: Anesthesiology

## 2018-06-10 ENCOUNTER — Encounter
Admission: RE | Disposition: A | Discharge status: Home / Self Care | Payer: Self-pay | Source: Home / Self Care | Attending: Orthopedic Surgery

## 2018-06-10 DIAGNOSIS — M7552 Bursitis of left shoulder: Secondary | ICD-10-CM | POA: Insufficient documentation

## 2018-06-10 DIAGNOSIS — J449 Chronic obstructive pulmonary disease, unspecified: Secondary | ICD-10-CM | POA: Diagnosis not present

## 2018-06-10 DIAGNOSIS — G8929 Other chronic pain: Secondary | ICD-10-CM | POA: Diagnosis not present

## 2018-06-10 DIAGNOSIS — Z79899 Other long term (current) drug therapy: Secondary | ICD-10-CM | POA: Insufficient documentation

## 2018-06-10 DIAGNOSIS — I10 Essential (primary) hypertension: Secondary | ICD-10-CM | POA: Insufficient documentation

## 2018-06-10 DIAGNOSIS — M25812 Other specified joint disorders, left shoulder: Secondary | ICD-10-CM | POA: Diagnosis not present

## 2018-06-10 DIAGNOSIS — M19012 Primary osteoarthritis, left shoulder: Secondary | ICD-10-CM | POA: Diagnosis present

## 2018-06-10 DIAGNOSIS — Z7982 Long term (current) use of aspirin: Secondary | ICD-10-CM | POA: Diagnosis not present

## 2018-06-10 HISTORY — DX: Other symptoms and signs involving the musculoskeletal system: R29.898

## 2018-06-10 HISTORY — DX: Unspecified osteoarthritis, unspecified site: M19.90

## 2018-06-10 HISTORY — DX: Presence of dental prosthetic device (complete) (partial): Z97.2

## 2018-06-10 SURGERY — SHOULDER ARTHROSCOPY WITH SUBACROMIAL DECOMPRESSION AND DISTAL CLAVICLE EXCISION
Anesthesia: General | Site: Shoulder | Laterality: Left

## 2018-06-10 MED ORDER — ONDANSETRON 4 MG PO TBDP: 4.0000 mg | ORAL_TABLET | Freq: Three times a day (TID) | ORAL | 0 refills | Status: DC | PRN

## 2018-06-10 MED ORDER — ONDANSETRON HCL 4 MG/2ML IJ SOLN
INTRAMUSCULAR | Status: DC | PRN
  Administered 2018-06-10: 4 mg via INTRAVENOUS

## 2018-06-10 MED ORDER — ASPIRIN EC 325 MG PO TBEC: 325.0000 mg | DELAYED_RELEASE_TABLET | Freq: Every day | ORAL | 0 refills | Status: AC

## 2018-06-10 MED ORDER — FENTANYL CITRATE (PF) 100 MCG/2ML IJ SOLN
INTRAMUSCULAR | Status: DC | PRN
  Administered 2018-06-10 (×2): 25 ug via INTRAVENOUS
  Administered 2018-06-10: 100 ug via INTRAVENOUS

## 2018-06-10 MED ORDER — OXYCODONE HCL 5 MG PO TABS: 5.0000 mg | ORAL_TABLET | ORAL | 0 refills | Status: AC | PRN

## 2018-06-10 MED ORDER — ACETAMINOPHEN 500 MG PO TABS: 1000.0000 mg | ORAL_TABLET | Freq: Three times a day (TID) | ORAL | 2 refills | Status: AC

## 2018-06-10 MED ORDER — LIDOCAINE HCL (CARDIAC) PF 100 MG/5ML IV SOSY
PREFILLED_SYRINGE | INTRAVENOUS | Status: DC | PRN
  Administered 2018-06-10: 20 mg via INTRATRACHEAL

## 2018-06-10 MED ORDER — LACTATED RINGERS IV SOLN
INTRAVENOUS | Status: DC | PRN
  Administered 2018-06-10: 6 mL
  Administered 2018-06-10: 7 mL
  Administered 2018-06-10: 3 mL

## 2018-06-10 MED ORDER — LACTATED RINGERS IV SOLN
INTRAVENOUS | Status: DC
  Administered 2018-06-10 (×2): via INTRAVENOUS

## 2018-06-10 MED ORDER — BUPIVACAINE LIPOSOME 1.3 % IJ SUSP
INTRAMUSCULAR | Status: DC | PRN
  Administered 2018-06-10: 20 mL

## 2018-06-10 MED ORDER — ACETAMINOPHEN 160 MG/5ML PO SOLN: 325.0000 mg | ORAL | Status: DC | PRN

## 2018-06-10 MED ORDER — ACETAMINOPHEN 325 MG PO TABS: 325.0000 mg | ORAL_TABLET | ORAL | Status: DC | PRN

## 2018-06-10 MED ORDER — SCOPOLAMINE 1 MG/3DAYS TD PT72
1.0000 | MEDICATED_PATCH | Freq: Once | TRANSDERMAL | Status: DC
  Administered 2018-06-10: 1.5 mg via TRANSDERMAL

## 2018-06-10 MED ORDER — GLYCOPYRROLATE 0.2 MG/ML IJ SOLN
INTRAMUSCULAR | Status: DC | PRN
  Administered 2018-06-10: 0.1 mg via INTRAVENOUS

## 2018-06-10 MED ORDER — PROPOFOL 10 MG/ML IV BOLUS
INTRAVENOUS | Status: DC | PRN
  Administered 2018-06-10: 200 mg via INTRAVENOUS

## 2018-06-10 MED ORDER — MIDAZOLAM HCL 5 MG/5ML IJ SOLN
INTRAMUSCULAR | Status: DC | PRN
  Administered 2018-06-10: 2 mg via INTRAVENOUS
  Administered 2018-06-10 (×2): 1 mg via INTRAVENOUS

## 2018-06-10 MED ORDER — ONDANSETRON HCL 4 MG/2ML IJ SOLN: 4.0000 mg | Freq: Once | INTRAMUSCULAR | Status: DC | PRN

## 2018-06-10 MED ORDER — DEXAMETHASONE SODIUM PHOSPHATE 4 MG/ML IJ SOLN
INTRAMUSCULAR | Status: DC | PRN
  Administered 2018-06-10: 4 mg via INTRAVENOUS

## 2018-06-10 MED ORDER — FENTANYL CITRATE (PF) 100 MCG/2ML IJ SOLN: 25.0000 ug | INTRAMUSCULAR | Status: DC | PRN

## 2018-06-10 MED ORDER — OXYCODONE HCL 5 MG PO TABS
5.0000 mg | ORAL_TABLET | Freq: Once | ORAL | Status: AC | PRN
  Administered 2018-06-10: 5 mg via ORAL

## 2018-06-10 MED ORDER — CEFAZOLIN SODIUM-DEXTROSE 2-4 GM/100ML-% IV SOLN
2.0000 g | Freq: Once | INTRAVENOUS | Status: AC
  Administered 2018-06-10: 2 g via INTRAVENOUS

## 2018-06-10 MED ORDER — OXYCODONE HCL 5 MG/5ML PO SOLN: 5.0000 mg | Freq: Once | ORAL | Status: AC | PRN

## 2018-06-10 MED ORDER — BUPIVACAINE HCL (PF) 0.5 % IJ SOLN
INTRAMUSCULAR | Status: DC | PRN
  Administered 2018-06-10: 20 mL

## 2018-06-10 SURGICAL SUPPLY — 45 items
ADAPTER IRRIG TUBE 2 SPIKE SOL (ADAPTER) ×6 IMPLANT
BUR RADIUS 4.0X18.5 (BURR) ×2 IMPLANT
BUR RADIUS 5.5 (BURR) ×3 IMPLANT
CANNULA 5.75X7 CRYSTAL CLEAR (CANNULA) ×3 IMPLANT
CHLORAPREP W/TINT 26ML (MISCELLANEOUS) ×3 IMPLANT
COOLER POLAR GLACIER W/PUMP (MISCELLANEOUS) ×3 IMPLANT
COVER LIGHT HANDLE UNIVERSAL (MISCELLANEOUS) ×6 IMPLANT
DRAPE IMP U-DRAPE 54X76 (DRAPES) ×4 IMPLANT
DRAPE INCISE IOBAN 66X45 STRL (DRAPES) ×3 IMPLANT
DRAPE SHEET LG 3/4 BI-LAMINATE (DRAPES) ×3 IMPLANT
DRAPE U-SHAPE 48X52 POLY STRL (PACKS) ×6 IMPLANT
DRSG TEGADERM 4X4.75 (GAUZE/BANDAGES/DRESSINGS) ×9 IMPLANT
GAUZE PETRO XEROFOAM 1X8 (MISCELLANEOUS) ×2 IMPLANT
GAUZE SPONGE 4X4 12PLY STRL (GAUZE/BANDAGES/DRESSINGS) ×3 IMPLANT
GLOVE BIO SURGEON STRL SZ7.5 (GLOVE) ×6 IMPLANT
GLOVE BIOGEL PI IND STRL 8 (GLOVE) ×1 IMPLANT
GLOVE BIOGEL PI INDICATOR 8 (GLOVE) ×2
GOWN STRL REIN 2XL XLG LVL4 (GOWN DISPOSABLE) ×3 IMPLANT
GOWN STRL REUS W/ TWL LRG LVL3 (GOWN DISPOSABLE) ×1 IMPLANT
GOWN STRL REUS W/TWL LRG LVL3 (GOWN DISPOSABLE) ×2
GOWN STRL REUS W/TWL LRG LVL4 (GOWN DISPOSABLE) IMPLANT
IV LACTATED RINGER IRRG 3000ML (IV SOLUTION) ×32
IV LR IRRIG 3000ML ARTHROMATIC (IV SOLUTION) ×8 IMPLANT
KIT STABILIZATION SHOULDER (MISCELLANEOUS) ×3 IMPLANT
KIT TURNOVER KIT A (KITS) ×3 IMPLANT
MASK FACE SPIDER DISP (MASK) ×3 IMPLANT
MAT GRAY ABSORB FLUID 28X50 (MISCELLANEOUS) ×6 IMPLANT
NDL SAFETY ECLIPSE 18X1.5 (NEEDLE) ×1 IMPLANT
NEEDLE HYPO 18GX1.5 SHARP (NEEDLE) ×2
Neptune manifold ×2 IMPLANT
PACK ARTHROSCOPY SHOULDER (MISCELLANEOUS) ×3 IMPLANT
PAD WRAPON POLAR SHDR UNIV (MISCELLANEOUS) ×1 IMPLANT
PENCIL SMOKE EVACUATOR (MISCELLANEOUS) ×3 IMPLANT
SET TUBE SUCT SHAVER OUTFL 24K (TUBING) ×3 IMPLANT
SET TUBE TIP INTRA-ARTICULAR (MISCELLANEOUS) ×3 IMPLANT
SLING ULTRA II M (MISCELLANEOUS) ×3 IMPLANT
SPONGE GAUZE 2X2 8PLY STER LF (GAUZE/BANDAGES/DRESSINGS) ×3
SPONGE GAUZE 2X2 8PLY STRL LF (GAUZE/BANDAGES/DRESSINGS) ×6 IMPLANT
SUT ETHILON 3-0 FS-10 30 BLK (SUTURE) ×6
SUTURE EHLN 3-0 FS-10 30 BLK (SUTURE) ×1 IMPLANT
SYR 10ML LL (SYRINGE) ×3 IMPLANT
TAPE MICROFOAM 4IN (TAPE) ×2 IMPLANT
TUBING ARTHRO INFLOW-ONLY STRL (TUBING) ×3 IMPLANT
WAND WEREWOLF FLOW 90D (MISCELLANEOUS) ×2 IMPLANT
WRAPON POLAR PAD SHDR UNIV (MISCELLANEOUS) ×3

## 2018-06-10 NOTE — Op Note (Signed)
SURGERY DATE: 06/10/2018  PRE-OP DIAGNOSIS:  1. Left acromioclavicular joint osteoarthritis 2. Left shoulder impingement/subacromial bursitis  POST-OP DIAGNOSIS: 1. Left acromioclavicular joint osteoarthritis 2. Left shoulder impingement/subacromial bursitis 3. Left shoulder arthrofibrosis   PROCEDURES:  1. Left shoulder arthroscopic lysis of adhesions 2. Left shoulder arthroscopic distal clavicle excision 3. Left extensive debridement of shoulder (glenohumeral and subacromial spaces) 4. Left subacromial decompression  SURGEON: Rosealee Albee, MD  ANESTHESIA: Gen with Exparil interscalene block  ESTIMATED BLOOD LOSS: 5cc  DRAINS:  none  TOTAL IV FLUIDS: per anesthesia   SPECIMENS: none  IMPLANTS:   None  OPERATIVE FINDINGS:  Examination under anesthesia: A careful examination under anesthesia was performed.  Passive range of motion was: FF: 150; ER at side: 40; ER in abduction: 90; IR in abduction: 50.  Anterior load shift: NT.  Posterior load shift: NT.  Sulcus in neutral: NT.  Sulcus in ER: NT.    Intra-operative findings: A thorough arthroscopic examination of the shoulder was performed.  The findings are: 1. Biceps tendon: Not visualized within the joint space 2. Superior labrum: Normal 3. Posterior labrum and capsule: normal 4. Inferior capsule and inferior recess: normal 5. Glenoid cartilage surface: Significant degenerative changes with mostly grade 2 lesions of the superior half of the glenoid 6. Supraspinatus attachment: mild articular sided fraying of the supraspinatus attachment 7. Posterior rotator cuff attachment: normal 8. Humeral head articular cartilage: Areas of grade 2 degenerative changes 9. Rotator interval: significant synovitis and arthrofibrosis with multiple adhesions 10: Subscapularis tendon: attachment intact 11. Anterior labrum: degenerative tissue 12. IGHL: Normal  OPERATIVE REPORT:   Indications for procedure: Jeffery Hubbard is a 43 y.o.  male with over 1 year of left shoulder pain.  He had prior left shoulder surgery consisting of arthroscopy and biceps tenodesis approximately 5 years ago.  He initially did well after the surgery.  Clinical exam and MRI were concerning for acromioclavicular joint arthritis as well as bursitis/shoulder impingement.  He underwent conservative measures in the form of ultrasound-guided acromioclavicular joint and subacromial space corticosteroid injections with partial relief of his symptoms, although this was short-lived.  After discussion of risks, benefits, and alternatives to surgery, the patient elected to proceed.  The patient does understand that given his lack of complete relief of symptoms with corticosteroid injections, he may not achieve complete resolution of his symptoms.   Procedure in detail:  I identified the patient in the pre-operative holding area.  I marked the operative shoulder with my initials. I reviewed the risks and benefits of the proposed surgical intervention, and the patient (and/or patient's guardian) wished to proceed.  Anesthesia was then performed with an interscalene block with Exparel.  The patient was transferred to the operative suite and placed in the beach chair position.    SCDs were placed on the lower extremities. Appropriate IV antibiotics were administered prior to incision. The operative upper extremity was then prepped and draped in standard fashion. A time out was performed confirming the correct extremity, correct patient, and correct procedure.   I then created a standard posterior portal with an 11 blade. The glenohumeral joint was easily entered with a blunt trochar and the arthroscope introduced. The findings of diagnostic arthroscopy are described above. I debrided degenerative tissue including the synovitic tissue about the rotator interval, the anterior labrum, glenoid, and humeral head.  Adhesions about the rotator interval and subscapularis tendon were  lysed using a combination of a shaver and ArthroCare wand.  I then coagulated the  inflamed synovium to obtain hemostasis and reduce the risk of post-operative swelling.   Next, the arthroscope was then introduced into the subacromial space. A direct lateral portal was created with an 11-blade after spinal needle localization. An extensive subacromial bursectomy was performed using a combination of the shaver and Arthrocare wand. The entire acromial undersurface was exposed and the CA ligament was subperiosteally elevated to expose the anterior acromial hook. A 5.425mm barrel burr was used to create a flat anterior and lateral aspect of the acromion, converting it from a Type 2 to a Type 1 acromion. Care was made to keep the deltoid fascia intact.  I then turned my attention to the arthroscopic distal clavicle excision. I identified the acromioclavicular joint. Surrounding bursal tissue was debrided and the edges of the joint were identified. I used the 5.685mm barrel burr to remove the distal clavicle parallel to the edge of the acromion. I was able to fit two widths of the burr into the space between the distal clavicle and acromion, signifying that I had removed ~6511mm of distal clavicle.  This was confirmed using a probe to measure the space between the distal clavicle and acromion.  Hemostasis was achieved with an Arthrocare wand.   Fluid was evacuated from the shoulder, and the portals were closed with 3-0 Nylon. Xeroform was applied to the portals. A sterile dressing was applied, followed by a Polar Care sleeve and a SlingShot shoulder immobilizer/sling. The patient was awakened from anesthesia without difficulty and was transferred to the PACU in stable condition.     COMPLICATIONS: none  DISPOSITION: plan for discharge home after recovery in PACU   POSTOPERATIVE PLAN: Remain in sling (except hygiene and elbow/wrist/hand RoM exercises as instructed by PT) x 1-2 weeks and and then wean as able PT to  begin 3-4 days after surgery.  Subacromial decompression + distal clavicle excision rehab protocol.

## 2018-06-10 NOTE — Anesthesia Preprocedure Evaluation (Signed)
Anesthesia Evaluation  Patient identified by MRN, date of birth, ID band Patient awake    Reviewed: Allergy & Precautions, H&P , NPO status , Patient's Chart, lab work & pertinent test results  History of Anesthesia Complications (+) PONV and history of anesthetic complications  Airway Mallampati: II  TM Distance: >3 FB Neck ROM: full    Dental no notable dental hx.    Pulmonary sleep apnea , COPD, former smoker,    Pulmonary exam normal breath sounds clear to auscultation       Cardiovascular + CAD, + Past MI and + Cardiac Stents  Normal cardiovascular exam Rhythm:regular Rate:Normal     Neuro/Psych PSYCHIATRIC DISORDERS    GI/Hepatic GERD  ,  Endo/Other    Renal/GU      Musculoskeletal   Abdominal   Peds  Hematology   Anesthesia Other Findings   Reproductive/Obstetrics                             Anesthesia Physical Anesthesia Plan  ASA: III  Anesthesia Plan: General LMA   Post-op Pain Management:  Regional for Post-op pain   Induction:   PONV Risk Score and Plan: 3 and Ondansetron, Dexamethasone and Scopolamine patch - Pre-op  Airway Management Planned:   Additional Equipment:   Intra-op Plan:   Post-operative Plan:   Informed Consent: I have reviewed the patients History and Physical, chart, labs and discussed the procedure including the risks, benefits and alternatives for the proposed anesthesia with the patient or authorized representative who has indicated his/her understanding and acceptance.     Plan Discussed with: CRNA  Anesthesia Plan Comments:         Anesthesia Quick Evaluation

## 2018-06-10 NOTE — Anesthesia Procedure Notes (Signed)
Anesthesia Regional Block: Interscalene brachial plexus block   Pre-Anesthetic Checklist: ,, timeout performed, Correct Patient, Correct Site, Correct Laterality, Correct Procedure, Correct Position, site marked, Risks and benefits discussed,  Surgical consent,  Pre-op evaluation,  At surgeon's request and post-op pain management  Laterality: Left  Prep: chloraprep       Needles:  Injection technique: Single-shot  Needle Type: Stimiplex     Needle Length: 10cm  Needle Gauge: 21     Additional Needles:   Procedures:,,,, ultrasound used (permanent image in chart),,,,  Narrative:  Start time: 06/10/2018 11:28 AM End time: 06/10/2018 11:35 AM Injection made incrementally with aspirations every 5 mL.  Performed by: Personally  Anesthesiologist: Ranee Gosselin, MD  Additional Notes: Functioning IV was confirmed and monitors applied. Ultrasound guidance: relevant anatomy identified, needle position confirmed, local anesthetic spread visualized around nerve(s)., vascular puncture avoided.  Image printed for medical record.  Negative aspiration and no paresthesias; incremental administration of local anesthetic. The patient tolerated the procedure well. Vitals signes recorded in RN notes.

## 2018-06-10 NOTE — Discharge Instructions (Signed)
Post-Op Instructions - Arthroscopic Subacromial Decompression and/or Distal Clavicle Excision  1. Bracing: You will wear a shoulder immobilizer or sling for no longer than 1-2 weeks. You can self wean from sling when it is comfortable to do so.   2. Driving: No driving for at least 2 weeks post-op.   3. Activity: Progress to motion as tolerated, moving from passive to active-assisted to active motion. Goal for motion by 4 weeks is 140 degrees forward flexion, 40 ER, IR behind back. Avoid abduction and ER/IR until after 4 weeks.  Return to daily activities normally takes 2-3 months on average.  4. Physical Therapy: Begins 3-4 days after surgery  5. Medications:  - You will be provided a prescription for narcotic pain medicine. After surgery, take 1-2 narcotic tablets every 4 hours if needed for severe pain.  - A prescription for anti-nausea medication will be provided in case the narcotic medicine causes nausea - take 1 tablet every 6 hours only if nauseated.   - Take ASA 325mg /day x 2 weeks to help prevent DVTs/PEs (blood clots).  - Take tylenol 1000mg  (2 extra strength tablets) every 8 hours to reduce the amount of narcotics needed.    If you are taking prescription medication for anxiety, depression, insomnia, muscle spasm, chronic pain, or for attention deficit disorder, you are advised that you are at a higher risk of adverse effects with use of narcotics post-op, including narcotic addiction/dependence, depressed breathing, death. If you use non-prescribed substances: alcohol, marijuana, cocaine, heroin, methamphetamines, etc., you are at a higher risk of adverse effects with use of narcotics post-op, including narcotic addiction/dependence, depressed breathing, death. You are advised that taking > 50 morphine milligram equivalents (MME) of narcotic pain medication per day results in twice the risk of overdose or death. For your prescription provided: hydrocodone 5 mg - taking more than 8  tablets per day would result in > 50 morphine milligram equivalents (MME) of narcotic pain medication. Be advised that we will prescribe narcotics short-term, for acute post-operative pain only - 3 weeks for major operations such as shoulder repair/reconstruction surgeries.    6. Post-Op Appointment:  Your first post-op appointment will be 10-14 days post-op.  7. Work or School: For most, but not all procedures, we advise staying out of work or school for at least 1 to 2 weeks in order to recover from the stress of surgery and to allow time for healing.   If you need a work or school note this can be provided.   8. Smoking: If you are a smoker, you need to refrain from smoking in the postoperative period. The nicotine in cigarettes will inhibit healing of your shoulder repair and decrease the chance of successful repair. Similarly, nicotine containing products (gum, patches) should be avoided.   Post-operative Brace: Apply and remove the brace you received as you were instructed to at the time of fitting and as described in detail as the braces instructions for use indicate.  Wear the brace for the period of time prescribed by your physician.  The brace can be cleaned with soap and water and allowed to air dry only.  Should the brace result in increased pain, decreased feeling (numbness/tingling), increased swelling or an overall worsening of your medical condition, please contact your doctor immediately.  If an emergency situation occurs as a result of wearing the brace after normal business hours, please dial 911 and seek immediate medical attention.  Let your doctor know if you have any further questions  about the brace issued to you. Refer to the shoulder sling instructions for use if you have any questions regarding the correct fit of your shoulder sling.  [facility] Customer Care for Troubleshooting: [phone]  Video that illustrates how to properly use a shoulder sling: "Instructions for  Proper Use of an Orthopaedic Sling" http://bass.com/   General Anesthesia, Adult, Care After This sheet gives you information about how to care for yourself after your procedure. Your health care provider may also give you more specific instructions. If you have problems or questions, contact your health care provider. What can I expect after the procedure? After the procedure, the following side effects are common:  Pain or discomfort at the IV site.  Nausea.  Vomiting.  Sore throat.  Trouble concentrating.  Feeling cold or chills.  Weak or tired.  Sleepiness and fatigue.  Soreness and body aches. These side effects can affect parts of the body that were not involved in surgery. Follow these instructions at home:  For at least 24 hours after the procedure:  Have a responsible adult stay with you. It is important to have someone help care for you until you are awake and alert.  Rest as needed.  Do not: ? Participate in activities in which you could fall or become injured. ? Drive. ? Use heavy machinery. ? Drink alcohol. ? Take sleeping pills or medicines that cause drowsiness. ? Make important decisions or sign legal documents. ? Take care of children on your own. Eating and drinking  Follow any instructions from your health care provider about eating or drinking restrictions.  When you feel hungry, start by eating small amounts of foods that are soft and easy to digest (bland), such as toast. Gradually return to your regular diet.  Drink enough fluid to keep your urine pale yellow.  If you vomit, rehydrate by drinking water, juice, or clear broth. General instructions  If you have sleep apnea, surgery and certain medicines can increase your risk for breathing problems. Follow instructions from your health care provider about wearing your sleep device: ? Anytime you are sleeping, including during daytime naps. ? While taking  prescription pain medicines, sleeping medicines, or medicines that make you drowsy.  Return to your normal activities as told by your health care provider. Ask your health care provider what activities are safe for you.  Take over-the-counter and prescription medicines only as told by your health care provider.  If you smoke, do not smoke without supervision.  Keep all follow-up visits as told by your health care provider. This is important. Contact a health care provider if:  You have nausea or vomiting that does not get better with medicine.  You cannot eat or drink without vomiting.  You have pain that does not get better with medicine.  You are unable to pass urine.  You develop a skin rash.  You have a fever.  You have redness around your IV site that gets worse. Get help right away if:  You have difficulty breathing.  You have chest pain.  You have blood in your urine or stool, or you vomit blood. Summary  After the procedure, it is common to have a sore throat or nausea. It is also common to feel tired.  Have a responsible adult stay with you for the first 24 hours after general anesthesia. It is important to have someone help care for you until you are awake and alert.  When you feel hungry, start by eating small amounts  of foods that are soft and easy to digest (bland), such as toast. Gradually return to your regular diet.  Drink enough fluid to keep your urine pale yellow.  Return to your normal activities as told by your health care provider. Ask your health care provider what activities are safe for you. This information is not intended to replace advice given to you by your health care provider. Make sure you discuss any questions you have with your health care provider. Document Released: 08/27/2000 Document Revised: 01/04/2017 Document Reviewed: 01/04/2017 Elsevier Interactive Patient Education  2019 [employer].  Information for Discharge  Teaching: EXPAREL (bupivacaine liposome injectable suspension)   Your surgeon or anesthesiologist gave you EXPAREL(bupivacaine) to help control your pain after surgery.   EXPAREL is a local anesthetic that provides pain relief by numbing the tissue around the surgical site.  EXPAREL is designed to release pain medication over time and can control pain for up to 72 hours.  Depending on how you respond to EXPAREL, you may require less pain medication during your recovery.  Possible side effects:  Temporary loss of sensation or ability to move in the area where bupivacaine was injected.  Nausea, vomiting, constipation  Rarely, numbness and tingling in your mouth or lips, lightheadedness, or anxiety may occur.  Call your doctor right away if you think you may be experiencing any of these sensations, or if you have other questions regarding possible side effects.  Follow all other discharge instructions given to you by your surgeon or nurse. Eat a healthy diet and drink plenty of water or other fluids.  If you return to the hospital for any reason within 96 hours following the administration of EXPAREL, it is important for health care providers to know that you have received this anesthetic. A teal colored band has been placed on your arm with the date, time and amount of EXPAREL you have received in order to alert and inform your health care providers. Please leave this armband in place for the full 96 hours following administration, and then you may remove the band.

## 2018-06-10 NOTE — Anesthesia Procedure Notes (Signed)
Procedure Name: LMA Insertion Date/Time: 06/10/2018 1:24 PM Performed by: Maree Krabbe, CRNA Pre-anesthesia Checklist: Patient identified, Emergency Drugs available, Suction available, Timeout performed and Patient being monitored Patient Re-evaluated:Patient Re-evaluated prior to induction Oxygen Delivery Method: Circle system utilized Preoxygenation: Pre-oxygenation with 100% oxygen Induction Type: IV induction LMA: LMA inserted LMA Size: 4.0 Number of attempts: 1 Placement Confirmation: positive ETCO2 and breath sounds checked- equal and bilateral Tube secured with: Tape Dental Injury: Teeth and Oropharynx as per pre-operative assessment

## 2018-06-10 NOTE — H&P (Signed)
Paper H&P to be scanned into permanent record. H&P reviewed. No significant changes noted.  

## 2018-06-10 NOTE — Anesthesia Postprocedure Evaluation (Signed)
Anesthesia Post Note  Patient: Jeffery Hubbard  Procedure(s) Performed: SHOULDER ARTHROSCOPY WITH SUBACROMIAL DECOMPRESSION AND DISTAL CLAVICLE EXCISION (Left Shoulder)  Patient location during evaluation: PACU Anesthesia Type: General Level of consciousness: awake and alert and oriented Pain management: satisfactory to patient Vital Signs Assessment: post-procedure vital signs reviewed and stable Respiratory status: spontaneous breathing, nonlabored ventilation and respiratory function stable Cardiovascular status: blood pressure returned to baseline and stable Postop Assessment: Adequate PO intake and No signs of nausea or vomiting Anesthetic complications: no    Cherly [place]

## 2018-06-10 NOTE — Transfer of Care (Signed)
Immediate Anesthesia Transfer of Care Note  Patient: Jeffery Hubbard  Procedure(s) Performed: SHOULDER ARTHROSCOPY WITH SUBACROMIAL DECOMPRESSION AND DISTAL CLAVICLE EXCISION (Left Shoulder)  Patient Location: PACU  Anesthesia Type: General LMA  Level of Consciousness: awake, alert  and patient cooperative  Airway and Oxygen Therapy: Patient Spontanous Breathing and Patient connected to supplemental oxygen  Post-op Assessment: Post-op Vital signs reviewed, Patient's Cardiovascular Status Stable, Respiratory Function Stable, Patent Airway and No signs of Nausea or vomiting  Post-op Vital Signs: Reviewed and stable  Complications: No apparent anesthesia complications

## 2018-06-13 ENCOUNTER — Encounter: Payer: Self-pay | Admitting: Orthopedic Surgery

## 2018-06-13 NOTE — OR Nursing (Signed)
Entered chart to correct Neptune manifold reference number.

## 2018-06-23 ENCOUNTER — Encounter: Payer: Self-pay | Admitting: Orthopedic Surgery

## 2019-07-03 DIAGNOSIS — S069XAA Unspecified intracranial injury with loss of consciousness status unknown, initial encounter: Secondary | ICD-10-CM | POA: Insufficient documentation

## 2019-07-03 DIAGNOSIS — F332 Major depressive disorder, recurrent severe without psychotic features: Secondary | ICD-10-CM | POA: Insufficient documentation

## 2019-12-15 DIAGNOSIS — Z9689 Presence of other specified functional implants: Secondary | ICD-10-CM | POA: Insufficient documentation

## 2019-12-15 DIAGNOSIS — G8929 Other chronic pain: Secondary | ICD-10-CM | POA: Insufficient documentation

## 2019-12-25 DIAGNOSIS — Z79899 Other long term (current) drug therapy: Secondary | ICD-10-CM | POA: Insufficient documentation

## 2020-01-27 ENCOUNTER — Ambulatory Visit (INDEPENDENT_AMBULATORY_CARE_PROVIDER_SITE_OTHER): Payer: Medicare Other | Admitting: Internal Medicine

## 2020-01-27 DIAGNOSIS — J449 Chronic obstructive pulmonary disease, unspecified: Secondary | ICD-10-CM

## 2020-01-27 DIAGNOSIS — Z9989 Dependence on other enabling machines and devices: Secondary | ICD-10-CM

## 2020-01-27 DIAGNOSIS — G4733 Obstructive sleep apnea (adult) (pediatric): Secondary | ICD-10-CM

## 2020-01-27 DIAGNOSIS — E669 Obesity, unspecified: Secondary | ICD-10-CM | POA: Diagnosis not present

## 2020-01-27 DIAGNOSIS — G894 Chronic pain syndrome: Secondary | ICD-10-CM

## 2020-01-27 DIAGNOSIS — E66811 Obesity, class 1: Secondary | ICD-10-CM

## 2020-01-27 HISTORY — DX: Obstructive sleep apnea (adult) (pediatric): G47.33
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
## 2020-04-15 ENCOUNTER — Ambulatory Visit
Admission: RE | Admit: 2020-04-15 | Discharge: 2020-04-15 | Disposition: A | Discharge status: Home / Self Care | Payer: Medicare Other | Source: Ambulatory Visit | Attending: Gerontology | Admitting: Gerontology

## 2020-04-15 ENCOUNTER — Other Ambulatory Visit: Payer: Self-pay | Admitting: Gerontology

## 2020-04-15 ENCOUNTER — Other Ambulatory Visit: Payer: Self-pay

## 2020-04-15 DIAGNOSIS — L539 Erythematous condition, unspecified: Secondary | ICD-10-CM

## 2020-04-15 DIAGNOSIS — M79601 Pain in right arm: Secondary | ICD-10-CM | POA: Diagnosis present

## 2020-12-30 NOTE — Progress Notes (Unsigned)
Pt did not show for scheduled appointment. Office staff will reach out to reschedule.  

## 2021-01-02 ENCOUNTER — Other Ambulatory Visit: Payer: Self-pay

## 2021-01-02 ENCOUNTER — Ambulatory Visit: Payer: Medicare Other

## 2021-03-14 ENCOUNTER — Other Ambulatory Visit: Payer: Self-pay

## 2021-03-14 ENCOUNTER — Ambulatory Visit: Payer: Medicare Other | Admitting: Internal Medicine

## 2021-03-14 NOTE — Progress Notes (Signed)
No show for appointment. Office will call to reschedule.  

## 2021-05-16 ENCOUNTER — Ambulatory Visit: Payer: Medicare Other | Admitting: Internal Medicine

## 2021-05-16 DIAGNOSIS — Z91199 Patient's noncompliance with other medical treatment and regimen due to unspecified reason: Secondary | ICD-10-CM

## 2021-05-16 NOTE — Progress Notes (Signed)
Unable to get on zoom call, rescheduled

## 2021-06-06 NOTE — Progress Notes (Signed)
No show for appointment. Office will call to reschedule.  

## 2021-06-07 ENCOUNTER — Ambulatory Visit: Payer: Medicare Other | Admitting: Internal Medicine

## 2021-08-29 ENCOUNTER — Emergency Department (EMERGENCY_DEPARTMENT_HOSPITAL)
Admission: EM | Admit: 2021-08-29 | Discharge: 2021-08-30 | Disposition: A | Discharge status: Home / Self Care | Payer: Medicare Other | Source: Home / Self Care | Attending: Emergency Medicine | Admitting: Emergency Medicine

## 2021-08-29 DIAGNOSIS — G8929 Other chronic pain: Secondary | ICD-10-CM | POA: Insufficient documentation

## 2021-08-29 DIAGNOSIS — F331 Major depressive disorder, recurrent, moderate: Secondary | ICD-10-CM | POA: Diagnosis not present

## 2021-08-29 DIAGNOSIS — M67919 Unspecified disorder of synovium and tendon, unspecified shoulder: Secondary | ICD-10-CM | POA: Diagnosis present

## 2021-08-29 DIAGNOSIS — F329 Major depressive disorder, single episode, unspecified: Secondary | ICD-10-CM | POA: Diagnosis not present

## 2021-08-29 DIAGNOSIS — F1290 Cannabis use, unspecified, uncomplicated: Secondary | ICD-10-CM

## 2021-08-29 DIAGNOSIS — I2510 Atherosclerotic heart disease of native coronary artery without angina pectoris: Secondary | ICD-10-CM | POA: Insufficient documentation

## 2021-08-29 DIAGNOSIS — F1210 Cannabis abuse, uncomplicated: Secondary | ICD-10-CM | POA: Insufficient documentation

## 2021-08-29 DIAGNOSIS — F4310 Post-traumatic stress disorder, unspecified: Secondary | ICD-10-CM | POA: Insufficient documentation

## 2021-08-29 DIAGNOSIS — R45851 Suicidal ideations: Secondary | ICD-10-CM | POA: Insufficient documentation

## 2021-08-29 DIAGNOSIS — Z79899 Other long term (current) drug therapy: Secondary | ICD-10-CM | POA: Insufficient documentation

## 2021-08-29 DIAGNOSIS — Z7982 Long term (current) use of aspirin: Secondary | ICD-10-CM | POA: Insufficient documentation

## 2021-08-29 DIAGNOSIS — J449 Chronic obstructive pulmonary disease, unspecified: Secondary | ICD-10-CM | POA: Insufficient documentation

## 2021-08-29 DIAGNOSIS — M5441 Lumbago with sciatica, right side: Secondary | ICD-10-CM | POA: Insufficient documentation

## 2021-08-29 DIAGNOSIS — M7552 Bursitis of left shoulder: Secondary | ICD-10-CM | POA: Insufficient documentation

## 2021-08-29 DIAGNOSIS — Z20822 Contact with and (suspected) exposure to covid-19: Secondary | ICD-10-CM | POA: Insufficient documentation

## 2021-08-29 DIAGNOSIS — F321 Major depressive disorder, single episode, moderate: Secondary | ICD-10-CM | POA: Diagnosis present

## 2021-08-29 DIAGNOSIS — F063 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, unspecified: Secondary | ICD-10-CM | POA: Insufficient documentation

## 2021-08-29 DIAGNOSIS — F39 Unspecified mood [affective] disorder: Secondary | ICD-10-CM

## 2021-08-29 DIAGNOSIS — F332 Major depressive disorder, recurrent severe without psychotic features: Secondary | ICD-10-CM | POA: Insufficient documentation

## 2021-08-29 LAB — URINE DRUG SCREEN, QUALITATIVE (ARMC ONLY)
Amphetamines, Ur Screen: NOT DETECTED
Barbiturates, Ur Screen: NOT DETECTED
Benzodiazepine, Ur Scrn: NOT DETECTED
Cannabinoid 50 Ng, Ur ~~LOC~~: POSITIVE — AB
Cocaine Metabolite,Ur ~~LOC~~: NOT DETECTED
MDMA (Ecstasy)Ur Screen: NOT DETECTED
Methadone Scn, Ur: NOT DETECTED
Opiate, Ur Screen: NOT DETECTED
Phencyclidine (PCP) Ur S: NOT DETECTED
Tricyclic, Ur Screen: NOT DETECTED

## 2021-08-29 LAB — CBC
HCT: 45.8 % (ref 39.0–52.0)
Hemoglobin: 14.8 g/dL (ref 13.0–17.0)
MCH: 28.8 pg (ref 26.0–34.0)
MCHC: 32.3 g/dL (ref 30.0–36.0)
MCV: 89.3 fL (ref 80.0–100.0)
Platelets: 185 10*3/uL (ref 150–400)
RBC: 5.13 MIL/uL (ref 4.22–5.81)
RDW: 13.5 % (ref 11.5–15.5)
WBC: 7.8 10*3/uL (ref 4.0–10.5)
nRBC: 0 % (ref 0.0–0.2)

## 2021-08-29 LAB — COMPREHENSIVE METABOLIC PANEL
ALT: 20 U/L (ref 0–44)
AST: 24 U/L (ref 15–41)
Albumin: 4.4 g/dL (ref 3.5–5.0)
Alkaline Phosphatase: 70 U/L (ref 38–126)
Anion gap: 8 (ref 5–15)
BUN: 17 mg/dL (ref 6–20)
CO2: 31 mmol/L (ref 22–32)
Calcium: 9.6 mg/dL (ref 8.9–10.3)
Chloride: 105 mmol/L (ref 98–111)
Creatinine, Ser: 1.09 mg/dL (ref 0.61–1.24)
GFR, Estimated: >60 mL/min (ref >60)
Glucose, Bld: 71 mg/dL (ref 70–99)
Potassium: 4 mmol/L (ref 3.5–5.1)
Sodium: 144 mmol/L (ref 135–145)
Total Bilirubin: 0.6 mg/dL (ref 0.3–1.2)
Total Protein: 7.8 g/dL (ref 6.5–8.1)

## 2021-08-29 LAB — ETHANOL: Alcohol, Ethyl (B): <10 mg/dL (ref <10)

## 2021-08-29 MED ORDER — LORAZEPAM 1 MG PO TABS
1.0000 mg | ORAL_TABLET | Freq: Once | ORAL | Status: AC
  Administered 2021-08-29: 1 mg via ORAL
  Filled 2021-08-29: qty 1

## 2021-08-29 NOTE — ED Notes (Signed)
Pt brught in by [employer] voluntary    pt states he got into an argument with wife tonight.  Pt also reports he did not take his meds today.  Pt denies SI or HI.  Pt denies etoh use.  Pt admits to using marijuana today.   Pt cooperative.   ?
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
## 2021-08-29 NOTE — ED Notes (Signed)
Pt dressed out by this RN & EDT Kathlen Mody), and [facility] present. Pt belongings placed in a bag including: ? ?1 pair brown boots ?1 orange shirt ?1 brown hat ?1 black jacket ?1 pair of jeans ?1 black cell phone ?1 wallet ?1 set of keys in black jacket pocket ?2 white socks ?1 black belt ?1 black under shirt ?1 pair of black underwear ?

## 2021-08-29 NOTE — ED Triage Notes (Signed)
See first nurse note for detailed info.  ? ?Pt cooperative in triage [facility] present during triage. Denies SI/HI. ?
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
## 2021-08-30 ENCOUNTER — Other Ambulatory Visit: Payer: Self-pay

## 2021-08-30 ENCOUNTER — Inpatient Hospital Stay
Admission: AD | Admit: 2021-08-30 | Discharge: 2021-09-05 | DRG: 881 | Disposition: A | Discharge status: Home / Self Care | Payer: Medicare Other | Source: Intra-hospital | Attending: Psychiatry | Admitting: Psychiatry

## 2021-08-30 ENCOUNTER — Encounter: Payer: Self-pay | Admitting: Psychiatry

## 2021-08-30 DIAGNOSIS — Z87820 Personal history of traumatic brain injury: Secondary | ICD-10-CM

## 2021-08-30 DIAGNOSIS — G8929 Other chronic pain: Secondary | ICD-10-CM | POA: Diagnosis present

## 2021-08-30 DIAGNOSIS — R45851 Suicidal ideations: Secondary | ICD-10-CM | POA: Diagnosis present

## 2021-08-30 DIAGNOSIS — K219 Gastro-esophageal reflux disease without esophagitis: Secondary | ICD-10-CM | POA: Diagnosis present

## 2021-08-30 DIAGNOSIS — F329 Major depressive disorder, single episode, unspecified: Secondary | ICD-10-CM | POA: Diagnosis present

## 2021-08-30 DIAGNOSIS — Z9682 Presence of neurostimulator: Secondary | ICD-10-CM

## 2021-08-30 DIAGNOSIS — F1210 Cannabis abuse, uncomplicated: Secondary | ICD-10-CM | POA: Diagnosis present

## 2021-08-30 DIAGNOSIS — I2510 Atherosclerotic heart disease of native coronary artery without angina pectoris: Secondary | ICD-10-CM | POA: Diagnosis present

## 2021-08-30 DIAGNOSIS — Z79899 Other long term (current) drug therapy: Secondary | ICD-10-CM | POA: Diagnosis not present

## 2021-08-30 DIAGNOSIS — Z87891 Personal history of nicotine dependence: Secondary | ICD-10-CM

## 2021-08-30 DIAGNOSIS — Z87442 Personal history of urinary calculi: Secondary | ICD-10-CM | POA: Diagnosis not present

## 2021-08-30 DIAGNOSIS — T43506A Underdosing of unspecified antipsychotics and neuroleptics, initial encounter: Secondary | ICD-10-CM | POA: Diagnosis present

## 2021-08-30 DIAGNOSIS — M5441 Lumbago with sciatica, right side: Secondary | ICD-10-CM | POA: Diagnosis present

## 2021-08-30 DIAGNOSIS — I252 Old myocardial infarction: Secondary | ICD-10-CM | POA: Diagnosis not present

## 2021-08-30 DIAGNOSIS — M19012 Primary osteoarthritis, left shoulder: Secondary | ICD-10-CM | POA: Diagnosis present

## 2021-08-30 DIAGNOSIS — Z91148 Patient's other noncompliance with medication regimen for other reason: Secondary | ICD-10-CM

## 2021-08-30 DIAGNOSIS — Z9852 Vasectomy status: Secondary | ICD-10-CM | POA: Diagnosis not present

## 2021-08-30 DIAGNOSIS — Z7982 Long term (current) use of aspirin: Secondary | ICD-10-CM | POA: Diagnosis not present

## 2021-08-30 DIAGNOSIS — G4733 Obstructive sleep apnea (adult) (pediatric): Secondary | ICD-10-CM | POA: Diagnosis present

## 2021-08-30 DIAGNOSIS — Z888 Allergy status to other drugs, medicaments and biological substances status: Secondary | ICD-10-CM

## 2021-08-30 DIAGNOSIS — F331 Major depressive disorder, recurrent, moderate: Secondary | ICD-10-CM | POA: Diagnosis not present

## 2021-08-30 DIAGNOSIS — Z7951 Long term (current) use of inhaled steroids: Secondary | ICD-10-CM | POA: Diagnosis not present

## 2021-08-30 DIAGNOSIS — Z885 Allergy status to narcotic agent status: Secondary | ICD-10-CM

## 2021-08-30 DIAGNOSIS — Z20822 Contact with and (suspected) exposure to covid-19: Secondary | ICD-10-CM | POA: Diagnosis present

## 2021-08-30 DIAGNOSIS — M19021 Primary osteoarthritis, right elbow: Secondary | ICD-10-CM | POA: Diagnosis present

## 2021-08-30 DIAGNOSIS — F4312 Post-traumatic stress disorder, chronic: Secondary | ICD-10-CM | POA: Diagnosis present

## 2021-08-30 DIAGNOSIS — Z972 Presence of dental prosthetic device (complete) (partial): Secondary | ICD-10-CM

## 2021-08-30 DIAGNOSIS — S069XAA Unspecified intracranial injury with loss of consciousness status unknown, initial encounter: Secondary | ICD-10-CM | POA: Diagnosis present

## 2021-08-30 DIAGNOSIS — R4587 Impulsiveness: Secondary | ICD-10-CM | POA: Diagnosis present

## 2021-08-30 DIAGNOSIS — Z9152 Personal history of nonsuicidal self-harm: Secondary | ICD-10-CM

## 2021-08-30 DIAGNOSIS — Z91128 Patient's intentional underdosing of medication regimen for other reason: Secondary | ICD-10-CM | POA: Diagnosis not present

## 2021-08-30 DIAGNOSIS — F4310 Post-traumatic stress disorder, unspecified: Secondary | ICD-10-CM | POA: Diagnosis present

## 2021-08-30 DIAGNOSIS — J449 Chronic obstructive pulmonary disease, unspecified: Secondary | ICD-10-CM | POA: Diagnosis present

## 2021-08-30 HISTORY — DX: Major depressive disorder, single episode, unspecified: F32.9

## 2021-08-30 LAB — RESP PANEL BY RT-PCR (FLU A&B, COVID) ARPGX2
Influenza A by PCR: NEGATIVE
Influenza B by PCR: NEGATIVE
SARS Coronavirus 2 by RT PCR: NEGATIVE

## 2021-08-30 LAB — VALPROIC ACID LEVEL: Valproic Acid Lvl: 41 ug/mL — ABNORMAL LOW (ref 50.0–100.0)

## 2021-08-30 LAB — ACETAMINOPHEN LEVEL: Acetaminophen (Tylenol), Serum: <10 ug/mL — ABNORMAL LOW (ref 10–30)

## 2021-08-30 LAB — SALICYLATE LEVEL: Salicylate Lvl: <7 mg/dL — ABNORMAL LOW (ref 7.0–30.0)

## 2021-08-30 MED ORDER — SERTRALINE HCL 100 MG PO TABS
200.0000 mg | ORAL_TABLET | Freq: Every day | ORAL | Status: DC
  Administered 2021-08-30 – 2021-09-04 (×6): 200 mg via ORAL
  Filled 2021-08-30 (×6): qty 2

## 2021-08-30 MED ORDER — ACETAMINOPHEN 325 MG PO TABS
650.0000 mg | ORAL_TABLET | Freq: Four times a day (QID) | ORAL | Status: DC | PRN
  Administered 2021-08-31 – 2021-09-05 (×12): 650 mg via ORAL
  Filled 2021-08-30 (×12): qty 2

## 2021-08-30 MED ORDER — BACLOFEN 10 MG PO TABS
10.0000 mg | ORAL_TABLET | Freq: Every day | ORAL | Status: DC
  Administered 2021-08-30: 10 mg via ORAL
  Filled 2021-08-30: qty 1

## 2021-08-30 MED ORDER — ALBUTEROL SULFATE (2.5 MG/3ML) 0.083% IN NEBU: 2.5000 mg | INHALATION_SOLUTION | Freq: Four times a day (QID) | RESPIRATORY_TRACT | Status: DC | PRN

## 2021-08-30 MED ORDER — PRAZOSIN HCL 2 MG PO CAPS
2.0000 mg | ORAL_CAPSULE | Freq: Every day | ORAL | Status: DC
  Administered 2021-08-30 – 2021-09-04 (×6): 2 mg via ORAL
  Filled 2021-08-30 (×6): qty 1

## 2021-08-30 MED ORDER — MAGNESIUM HYDROXIDE 400 MG/5ML PO SUSP: 30.0000 mL | Freq: Every day | ORAL | Status: DC | PRN

## 2021-08-30 MED ORDER — ALUM & MAG HYDROXIDE-SIMETH 200-200-20 MG/5ML PO SUSP: 30.0000 mL | ORAL | Status: DC | PRN

## 2021-08-30 MED ORDER — VITAMIN D3 25 MCG (1000 UNIT) PO TABS
2000.0000 [IU] | ORAL_TABLET | Freq: Every day | ORAL | Status: DC
  Administered 2021-08-31 – 2021-09-05 (×6): 2000 [IU] via ORAL
  Filled 2021-08-30 (×7): qty 2

## 2021-08-30 MED ORDER — DIVALPROEX SODIUM ER 500 MG PO TB24
1000.0000 mg | ORAL_TABLET | Freq: Every day | ORAL | Status: DC
  Administered 2021-08-30 – 2021-09-04 (×6): 1000 mg via ORAL
  Filled 2021-08-30 (×6): qty 2

## 2021-08-30 MED ORDER — METOPROLOL SUCCINATE ER 50 MG PO TB24: 25.0000 mg | ORAL_TABLET | Freq: Every day | ORAL | Status: DC

## 2021-08-30 MED ORDER — SERTRALINE HCL 50 MG PO TABS
200.0000 mg | ORAL_TABLET | Freq: Every day | ORAL | Status: DC
Start: 2021-08-30 — End: 2021-08-30

## 2021-08-30 MED ORDER — MOMETASONE FURO-FORMOTEROL FUM 100-5 MCG/ACT IN AERO
2.0000 | INHALATION_SPRAY | Freq: Two times a day (BID) | RESPIRATORY_TRACT | Status: DC
  Administered 2021-08-31 – 2021-09-05 (×8): 2 via RESPIRATORY_TRACT
  Filled 2021-08-30: qty 8.8

## 2021-08-30 MED ORDER — ALBUTEROL SULFATE (2.5 MG/3ML) 0.083% IN NEBU
2.5000 mg | INHALATION_SOLUTION | Freq: Four times a day (QID) | RESPIRATORY_TRACT | Status: DC | PRN
  Filled 2021-08-30: qty 3

## 2021-08-30 MED ORDER — VITAMIN D 25 MCG (1000 UNIT) PO TABS
2000.0000 [IU] | ORAL_TABLET | Freq: Every day | ORAL | Status: DC
  Administered 2021-08-30: 2000 [IU] via ORAL
  Filled 2021-08-30: qty 2

## 2021-08-30 MED ORDER — ASPIRIN EC 81 MG PO TBEC
81.0000 mg | DELAYED_RELEASE_TABLET | Freq: Every day | ORAL | Status: DC
  Administered 2021-08-30: 81 mg via ORAL
  Filled 2021-08-30: qty 1

## 2021-08-30 MED ORDER — PREGABALIN 50 MG PO CAPS
200.0000 mg | ORAL_CAPSULE | Freq: Three times a day (TID) | ORAL | Status: DC
  Administered 2021-08-30 – 2021-09-05 (×18): 200 mg via ORAL
  Filled 2021-08-30 (×18): qty 4

## 2021-08-30 MED ORDER — PRAZOSIN HCL 2 MG PO CAPS: 2.0000 mg | ORAL_CAPSULE | Freq: Every day | ORAL | Status: DC

## 2021-08-30 MED ORDER — ATORVASTATIN CALCIUM 20 MG PO TABS
80.0000 mg | ORAL_TABLET | Freq: Every day | ORAL | Status: DC
  Administered 2021-08-31 – 2021-09-05 (×6): 80 mg via ORAL
  Filled 2021-08-30 (×7): qty 4

## 2021-08-30 MED ORDER — BACLOFEN 10 MG PO TABS
10.0000 mg | ORAL_TABLET | Freq: Every day | ORAL | Status: DC
  Administered 2021-08-31 – 2021-09-05 (×6): 10 mg via ORAL
  Filled 2021-08-30 (×7): qty 1

## 2021-08-30 MED ORDER — PANTOPRAZOLE SODIUM 40 MG PO TBEC
40.0000 mg | DELAYED_RELEASE_TABLET | Freq: Every day | ORAL | Status: DC
  Administered 2021-08-31 – 2021-09-05 (×6): 40 mg via ORAL
  Filled 2021-08-30 (×6): qty 1

## 2021-08-30 MED ORDER — MOMETASONE FURO-FORMOTEROL FUM 100-5 MCG/ACT IN AERO: 2.0000 | INHALATION_SPRAY | Freq: Two times a day (BID) | RESPIRATORY_TRACT | Status: DC

## 2021-08-30 MED ORDER — PANTOPRAZOLE SODIUM 40 MG PO TBEC
40.0000 mg | DELAYED_RELEASE_TABLET | Freq: Every day | ORAL | Status: DC
Start: 2021-08-30 — End: 2021-08-30
  Administered 2021-08-30: 40 mg via ORAL
  Filled 2021-08-30: qty 1

## 2021-08-30 MED ORDER — ASPIRIN EC 81 MG PO TBEC
81.0000 mg | DELAYED_RELEASE_TABLET | Freq: Every day | ORAL | Status: DC
  Administered 2021-08-31 – 2021-09-05 (×6): 81 mg via ORAL
  Filled 2021-08-30 (×6): qty 1

## 2021-08-30 MED ORDER — DIVALPROEX SODIUM ER 250 MG PO TB24: 1000.0000 mg | ORAL_TABLET | Freq: Every day | ORAL | Status: DC

## 2021-08-30 MED ORDER — METOPROLOL SUCCINATE ER 25 MG PO TB24
25.0000 mg | ORAL_TABLET | Freq: Every day | ORAL | Status: DC
  Administered 2021-08-30 – 2021-09-04 (×6): 25 mg via ORAL
  Filled 2021-08-30 (×6): qty 1

## 2021-08-30 MED ORDER — BUPROPION HCL ER (XL) 150 MG PO TB24
450.0000 mg | ORAL_TABLET | Freq: Every morning | ORAL | Status: DC
  Administered 2021-08-31 – 2021-09-05 (×6): 450 mg via ORAL
  Filled 2021-08-30 (×6): qty 3

## 2021-08-30 MED ORDER — PREGABALIN 75 MG PO CAPS
200.0000 mg | ORAL_CAPSULE | Freq: Three times a day (TID) | ORAL | Status: DC
  Administered 2021-08-30: 200 mg via ORAL
  Filled 2021-08-30: qty 1

## 2021-08-30 MED ORDER — ATORVASTATIN CALCIUM 20 MG PO TABS
80.0000 mg | ORAL_TABLET | Freq: Every day | ORAL | Status: DC
  Administered 2021-08-30: 80 mg via ORAL
  Filled 2021-08-30: qty 4

## 2021-08-30 MED ORDER — BUPROPION HCL ER (XL) 150 MG PO TB24
450.0000 mg | ORAL_TABLET | Freq: Every morning | ORAL | Status: DC
  Administered 2021-08-30: 450 mg via ORAL
  Filled 2021-08-30: qty 3

## 2021-08-30 NOTE — ED Notes (Signed)
Pt given lunch tray.

## 2021-08-30 NOTE — ED Notes (Signed)
Nightshift did not order diet for patient. Ordered now by this RN.  ?

## 2021-08-30 NOTE — ED Notes (Addendum)
Pt waiting results of COVID results to be transferred down to BMU.  ?

## 2021-08-30 NOTE — ED Notes (Signed)
IVC/ pt pending placement  ?
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
## 2021-08-30 NOTE — Tx Team (Signed)
Initial Treatment Plan ?08/30/2021 ?5:50 PM ?Jeffery Hubbard ?[id number] ? ? ? ?PATIENT STRESSORS: ?Financial difficulties   ?Health problems   ?Medication change or noncompliance   ?Traumatic event   ? ? ?PATIENT STRENGTHS: ?Average or above average intelligence  ?Communication skills  ?Motivation for treatment/growth  ?Supportive family/friends  ? ? ?PATIENT IDENTIFIED PROBLEMS: ?Depression  ?Non compliant with medication  ?  ?  ?  ?  ?  ?  ?  ?  ? ?DISCHARGE CRITERIA:  ?Ability to meet basic life and health needs ?Adequate post-discharge living arrangements ?Medical problems require only outpatient monitoring ?Verbal commitment to aftercare and medication compliance ? ?PRELIMINARY DISCHARGE PLAN: ?Attend aftercare/continuing care group ?Return to previous living arrangement ? ?PATIENT/FAMILY INVOLVEMENT: ?This treatment plan has been presented to and reviewed with the patient, Jeffery Hubbard, and/or family member,.  The patient and family have been given the opportunity to ask questions and make suggestions. ? ?Merlene Morse, RN ?08/30/2021, 5:50 PM ?

## 2021-08-30 NOTE — BH Assessment (Signed)
Patient is under review at [facility]. Awaiting accepting details.  °

## 2021-08-30 NOTE — ED Notes (Signed)
Resumed care from [facility] rn.  Pt alert  pt waiting on admission.   ?

## 2021-08-30 NOTE — BH Assessment (Addendum)
Referral information for Psychiatric Hospitalization faxed to: ? ?[facility] [phone]- [phone]) ? ?[facility] [phone]),  ? ?[facility] [phone]), Pt denied due to TBI. ? ?[facility] [phone]),  ? ?[facility] [phone] -[phone]),  ? ?[facility] [phone]). ? ?[facility] [phone]) ? ?
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
## 2021-08-30 NOTE — BH Assessment (Signed)
Patient is to be admitted to [facility] today 08/30/21 pending negative Covid results by Dr. Weber Cooks.  ?Attending Physician will be Dr.  Weber Cooks .   ?Patient has been assigned to room 323, by [place].   ?  ?ER staff is aware of the admission: ?Lattie Haw, ER Secretary   ?Dr. Jacqualine Code, ER MD  ?Donella Stade, Patient's Nurse  ?Seth Bake, Patient Access.  ?

## 2021-08-30 NOTE — ED Notes (Signed)
Report called to GG in BMU. Pt to be transferred to room 323.  ?

## 2021-08-31 DIAGNOSIS — F331 Major depressive disorder, recurrent, moderate: Secondary | ICD-10-CM | POA: Diagnosis not present

## 2021-08-31 NOTE — Plan of Care (Signed)
?  Problem: Education: ?Goal: Knowledge of [place] [facility] Education information/materials will improve ?Outcome: Progressing ?Goal: Emotional status will improve ?Outcome: Progressing ?Goal: Mental status will improve ?Outcome: Progressing ?Goal: Verbalization of understanding the information provided will improve ?Outcome: Progressing ?  ?Problem: Health Behavior/Discharge Planning: ?Goal: Identification of resources available to assist in meeting health care needs will improve ?Outcome: Progressing ?Goal: Compliance with treatment plan for underlying cause of condition will improve ?Outcome: Progressing ?  ?Problem: Education: ?Goal: Utilization of techniques to improve thought processes will improve ?Outcome: Progressing ?Goal: Knowledge of the prescribed therapeutic regimen will improve ?Outcome: Progressing ?  ?Problem: Activity: ?Goal: Interest or engagement in leisure activities will improve ?Outcome: Progressing ?Goal: Imbalance in normal sleep/wake cycle will improve ?Outcome: Progressing ?  ?Problem: Coping: ?Goal: Coping ability will improve ?Outcome: Progressing ?Goal: Will verbalize feelings ?Outcome: Progressing ?  ?
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
## 2021-08-31 NOTE — Group Note (Addendum)
LCSW Group Therapy Note ? ? ?Group Date: 08/31/2021 ?Start Time: 1300 ?End Time: [id number] ? ? ?Type of Therapy and Topic:  Group Therapy:  ? ?Participation Level:  Did Not Attend ? ?Description of Group:   ? In this group patients will be encouraged to explore what they see as obstacles to their own wellness and recovery. They will be guided to discuss their thoughts, feelings, and behaviors related to these obstacles. The group will process together ways to cope with barriers, with attention given to specific choices patients can make. Each patient will be challenged to identify changes they are motivated to make in order to overcome their obstacles. This group will be process-oriented, with patients participating in exploration of their own experiences as well as giving and receiving support and challenge from other group members. ?  ?Therapeutic Goals: ?Patient will identify personal and current obstacles as they relate to admission. ?Patient will identify barriers that currently interfere with their wellness or overcoming obstacles.  ?Patient will identify feelings, thought process and behaviors related to these barriers. ?Patient will identify two changes they are willing to make to overcome these obstacles:  ?  ? ? ?Summary of Patient Progress:   ? ?X ? ?Therapeutic Modalities:  ? ?Rozann Lesches, LCSWA ?08/31/2021  4:12 PM   ? ?

## 2021-08-31 NOTE — Progress Notes (Signed)
Patient in his room sleeping this evening refused HS snack. Compliant with medication denies SI/HI/A/VH and verbally contracted for safety. No adverse drug noted. Support and encouragement provided. ?

## 2021-08-31 NOTE — [facility] Suicide Risk Assessment (Signed)
[facility] Admission Suicide Risk Assessment ? ? ?Nursing information obtained from:  Patient ?Demographic factors:  Male, Unemployed, Caucasian ?Current Mental Status:  NA ?Loss Factors:  Financial problems / change in socioeconomic status ?Historical Factors:  NA ?Risk Reduction Factors:  Responsible for children under 48 years of age, Living with another person, especially a relative ? ?Total Time spent with patient: 1 hour ?Principal Problem: MDD (major depressive disorder) ?Diagnosis:  Principal Problem: ?  MDD (major depressive disorder) ?Active Problems: ?  OSA on CPAP ?  Cannabis abuse ?  Chronic midline low back pain with right-sided sciatica ?  Chronic pain ?  Post-traumatic stress disorder, unspecified ?  Traumatic brain injury ? ?Subjective Data: Patient seen and chart reviewed.  46 year old man with a history of PTSD, brain trauma, recurrent depression and cannabis abuse brought into the hospital under IVC after agitated behavior smashing up property at home throwing away medications and worsening mood instability.  Patient denies suicidal ideation.  Denies any wish to die.  Denies psychotic symptoms.  Minimizes his symptoms in general although does acknowledge his behavior ? ?Continued Clinical Symptoms:  ?Alcohol Use Disorder Identification Test Final Score (AUDIT): 0 ?The "Alcohol Use Disorders Identification Test", Guidelines for Use in Primary Care, Second Edition.  World Science writer [facility]). ?Score between 0-7:  no or low risk or alcohol related problems. ?Score between 8-15:  moderate risk of alcohol related problems. ?Score between 16-19:  high risk of alcohol related problems. ?Score 20 or above:  warrants further diagnostic evaluation for alcohol dependence and treatment. ? ? ?CLINICAL FACTORS:  ? Depression:   Comorbid alcohol abuse/dependence ?Alcohol/Substance Abuse/Dependencies ?Medical Diagnoses and Treatments/Surgeries ? ? ?Musculoskeletal: ?Strength & Muscle Tone: within normal  limits ?Gait & Station: normal ?Patient leans: N/A ? ?Psychiatric Specialty Exam: ? ?Presentation  ?General Appearance: Appropriate for Environment ? ?Eye Contact:Good ? ?Speech:Clear and Coherent ? ?Speech Volume:Normal ? ?Handedness:Right ? ? ?Mood and Affect  ?Mood:Euthymic ? ?Affect:Appropriate ? ? ?Thought Process  ?Thought Processes:Coherent ? ?Descriptions of Associations:Intact ? ?Orientation:Full (Time, Place and Person) ? ?Thought Content:Logical; WDL ? ?History of Schizophrenia/Schizoaffective disorder:No ? ?Duration of Psychotic Symptoms:No data recorded ?Hallucinations:Hallucinations: None ? ?Ideas of Reference:None ? ?Suicidal Thoughts:Suicidal Thoughts: No ? ?Homicidal Thoughts:Homicidal Thoughts: No ? ? ?Sensorium  ?Memory:Immediate Good; Recent Good; Remote Good ? ?Judgment:Fair ? ?Insight:Fair ? ? ?Executive Functions  ?Concentration:Good ? ?Attention Span:Fair ? ?Recall:Fair ? ?Fund of Knowledge:Fair ? ?Language:Fair ? ? ?Psychomotor Activity  ?Psychomotor Activity:Psychomotor Activity: Normal ? ? ?Assets  ?Assets:Communication Skills; Desire for Improvement; Physical Health; Social Support ? ? ?Sleep  ?Sleep:Sleep: Poor ?Number of Hours of Sleep: 5 ? ? ? ?Physical Exam: ?Physical Exam ?Vitals and nursing note reviewed.  ?Constitutional:   ?   Appearance: Normal appearance.  ?HENT:  ?   Head: Normocephalic and atraumatic.  ?   Mouth/Throat:  ?   Pharynx: Oropharynx is clear.  ?Eyes:  ?   Pupils: Pupils are equal, round, and reactive to light.  ?Cardiovascular:  ?   Rate and Rhythm: Normal rate and regular rhythm.  ?Pulmonary:  ?   Effort: Pulmonary effort is normal.  ?   Breath sounds: Normal breath sounds.  ?Abdominal:  ?   General: Abdomen is flat.  ?   Palpations: Abdomen is soft.  ?Musculoskeletal:     ?   General: Normal range of motion.  ?Skin: ?   General: Skin is warm and dry.  ?Neurological:  ?   General: No focal deficit present.  ?  Mental Status: He is alert. Mental status is at  baseline.  ?Psychiatric:     ?   Attention and Perception: He is inattentive.     ?   Mood and Affect: Mood normal. Affect is blunt.     ?   Speech: Speech is tangential.     ?   Behavior: Behavior is cooperative.     ?   Thought Content: Thought content normal.     ?   Cognition and Memory: Memory is impaired.  ? ?Review of Systems  ?Constitutional: Negative.   ?HENT: Negative.    ?Eyes: Negative.   ?Respiratory: Negative.    ?Cardiovascular: Negative.   ?Gastrointestinal: Negative.   ?Musculoskeletal: Negative.   ?Skin: Negative.   ?Neurological: Negative.   ?Psychiatric/Behavioral:  Positive for substance abuse. Negative for depression, hallucinations, memory loss and suicidal ideas. The patient is not nervous/anxious and does not have insomnia.   ?Blood pressure (!) 149/91, pulse 76, temperature 98.6 ?F (37 ?C), temperature source Oral, resp. rate 17, height 5\' 8"  (1.727 m), weight 93 kg, SpO2 99 %. Body mass index is 31.17 kg/m?. ? ? ?COGNITIVE FEATURES THAT CONTRIBUTE TO RISK:  ?Thought constriction (tunnel vision)   ? ?SUICIDE RISK:  ? Minimal: No identifiable suicidal ideation.  Patients presenting with no risk factors but with morbid ruminations; may be classified as minimal risk based on the severity of the depressive symptoms ? ?PLAN OF CARE: Continue 15-minute checks.  Restart medication.  Engage patient in individual and group therapy and engage in assessment with full treatment team.  Ongoing assessment of dangerousness prior to discharge ? ?I certify that inpatient services furnished can reasonably be expected to improve the patient's condition.  ? ? , MD ?08/31/2021, 5:52 PM ? ?
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
## 2021-08-31 NOTE — Progress Notes (Signed)
Patient A&Ox4. Flat affect and depressed mood. Patient rates his depression today a 5 out of 10 and anxiety 7 out of 10. Patient appears tired with little appetite. Patient did not eat despite encouragement but did drink fluids. Patient stated he did not sleep well and woke up throughout the night due to his pain. Patient stated his pain level was a 7 out of 10 this morning at his R lower hip and R lower back side. Patient received tylenol po prn which provided little relief. MD notified and patient encouraged to call his wife in order to get his nerve stimulator charging pad to the unit to assist with pain relief. Patient did not attend groups and rested throughout the day. Denies SI, HI, and A/V/H. Denies any plan/intent. Med compliant. Patient currently in room continuing to rest. No s/s of current distress. Q15 min checks for safety.  ? ? ? ? 08/31/21 0812  ?Psych Admission Type (Psych Patients Only)  ?Admission Status Involuntary  ?Psychosocial Assessment  ?Patient Complaints Other (Comment) ?(pain)  ?Eye Contact Fair  ?Facial Expression Sad  ?Affect Appropriate to circumstance;Depressed  ?Speech Logical/coherent  ?Interaction Assertive  ?Motor Activity Slow  ?Appearance/Hygiene Unremarkable  ?Behavior Characteristics Cooperative;Appropriate to situation  ?Mood Depressed;Pleasant  ?Thought Process  ?Coherency Blocking  ?Content WDL  ?Delusions None reported or observed  ?Perception WDL  ?Hallucination None reported or observed  ?Judgment Impaired  ?Confusion WDL  ?Danger to Self  ?Current suicidal ideation? Denies  ?Danger to Others  ?Danger to Others None reported or observed  ? ? ?

## 2021-08-31 NOTE — Progress Notes (Signed)
Patient approached nurses station requesting his nerve stimulator for his back pain.  Rates pain level at 8/10 at this encounter. Patient provided information on how the mechanism worked and how to tell when it was fully charged. Patient is pleasant and cooperative. Endorses depression and anxiety states that he is trying to get used to being on the unit. He denies si hi avh. Will continue to monitor with q15 minute safety checks.  Encouraged patient to come to staff with any concerns he may have.  ? ? ? ? ?C Butler-Nicholson, LPN ?

## 2021-08-31 NOTE — Plan of Care (Signed)
  Problem: Education: Goal: Knowledge of [facility] Education information/materials will improve Outcome: Progressing Goal: Emotional status will improve Outcome: Progressing   

## 2021-08-31 NOTE — Progress Notes (Signed)
Recreation Therapy Notes ? ? ?Date: 08/31/2021 ? ?Time: 10:50 am  ? ?Location: Courtyard   ? ?Behavioral response: N/A ?  ?Intervention Topic: Relaxation   ? ?Discussion/Intervention: ?Patient refused to attend group.  ? ?Clinical Observations/Feedback:  ?Patient refused to attend group.  ?  ?Jeffery Hubbard LRT/CTRS ? ? ? ? ? ? ? ? ?[name] ?08/31/2021 12:42 PM ?

## 2021-08-31 NOTE — [facility] Suicide Risk Assessment (Signed)
[facility] INPATIENT:  Family/Significant Other Suicide Prevention Education ? ?Suicide Prevention Education:  ?Contact Attempts: Jeffery Hubbard, wife, [phone] been identified by the patient as the family member/significant other with whom the patient will be residing, and identified as the person(s) who will aid the patient in the event of a mental health crisis.  With written consent from the patient, two attempts were made to provide suicide prevention education, prior to and/or following the patient's discharge.  We were unsuccessful in providing suicide prevention education.  A suicide education pamphlet was given to the patient to share with family/significant other. ? ?Date and time of first attempt:08/31/2021 at 11:57 AM ?Date and time of second attempt:Second attempt is needed.  ? ?CSW left HIPAA compliant voicemail requesting return call.   ? ? ?Jeffery Hubbard ?08/31/2021, 11:57 AM ?

## 2021-09-01 DIAGNOSIS — F331 Major depressive disorder, recurrent, moderate: Secondary | ICD-10-CM | POA: Diagnosis not present

## 2021-09-01 NOTE — Progress Notes (Signed)
Patient stated in treatment team that his goals for treatment is to get help with his depression, anxiety and anger. ?
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
## 2021-09-01 NOTE — Progress Notes (Signed)
Recreation Therapy Notes ? ?INPATIENT RECREATION TR PLAN ? ?Patient Details ?Name: ILYAAS MUSTO ?MRN: [id number] ?DOB: [birth year] ?Today's Date: 09/01/2021 ? ?Rec Therapy Plan ?Is patient appropriate for Therapeutic Recreation?: Yes ?Treatment times per week: at least 3 ?Estimated Length of Stay: 5-7 days ?TR Treatment/Interventions: Group participation (Comment) ? ?Discharge Criteria ?Pt will be discharged from therapy if:: Discharged ?Treatment plan/goals/alternatives discussed and agreed upon by:: Patient/family ? ?Discharge Summary ?  ? ? ?Kieron Kantner ?09/01/2021, 4:10 PM ?

## 2021-09-01 NOTE — Progress Notes (Signed)
Recreation Therapy Notes ? ?INPATIENT RECREATION THERAPY ASSESSMENT ? ?Patient Details ?Name: Jeffery Hubbard ?MRN: [id number] ?DOB: [birth year] ?Today's Date: 09/01/2021 ?      ?Information Obtained From: ?Patient ? ?Able to Participate in Assessment/Interview: ?Yes ? ?Patient Presentation: ?Responsive ? ?Reason for Admission (Per Patient): ?Active Symptoms ? ?Patient Stressors: ?Relationship ? ?Coping Skills:   ?Prayer ? ?Leisure Interests (2+):  ?[place]ra [place] - Hiking ? ?Frequency of Recreation/Participation: ?Monthly ? ?Awareness of Community Resources:  ?  ? ?Community Resources:  ?  ? ?Current Use: ?  ? ?If no, Barriers?: ?  ? ?Expressed Interest in [employer] Information: ?  ? ?[place] of Residence:  ?[place] ? ?Patient Main Form of Transportation: ?Car ? ?Patient Strengths:  ?N/A ? ?Patient Identified Areas of Improvement:  ?My anger ? ?Patient Goal for Hospitalization:  ?Help with my depression ? ?Current SI (including self-harm):  ?No ? ?Current HI:  ?No ? ?Current AVH: ?No ? ?Staff Intervention Plan: ?Group Attendance, Collaborate with Interdisciplinary Treatment Team ? ?Consent to Intern Participation: ?N/A ? ?Lahela Woodin ?09/01/2021, 4:08 PM ?

## 2021-09-01 NOTE — Progress Notes (Signed)
Patient came to nurses station asking for his nerve stimulator and it was provided to him. Patient remains safe on the unit. ?

## 2021-09-01 NOTE — Group Note (Signed)
[facility] LCSW Group Therapy Note ? ? ?Group Date: 09/01/2021 ?Start Time: 1300 ?End Time: 1400 ? ?Type of Therapy and Topic:  Group Therapy:  Feelings around Relapse and Recovery ? ?Participation Level:  Did Not Attend  ? ?Mood: ? ?Description of Group:   ? Patients in this group will discuss emotions they experience before and after a relapse. They will process how experiencing these feelings, or avoidance of experiencing them, relates to having a relapse. Facilitator will guide patients to explore emotions they have related to recovery. Patients will be encouraged to process which emotions are more powerful. They will be guided to discuss the emotional reaction significant others in their lives may have to patients? relapse or recovery. Patients will be assisted in exploring ways to respond to the emotions of others without this contributing to a relapse. ? ?Therapeutic Goals: ?Patient will identify two or more emotions that lead to relapse for them:  ?Patient will identify two emotions that result when they relapse:  ?Patient will identify two emotions related to recovery:  ?Patient will demonstrate ability to communicate their needs through discussion and/or role plays. ? ? ?Summary of Patient Progress: ? ?Patient did not attend group despite encouraged participation.  ? ? ?Therapeutic Modalities:   ?Cognitive Behavioral Therapy ?Solution-Focused Therapy ?Assertiveness Training ?Relapse Prevention Therapy ? ? ?Jayvon Mounger W Yemaya Barnier, LCSWA ?

## 2021-09-01 NOTE — Plan of Care (Signed)
D- Patient alert and oriented. Patient presented in a pleasant mood on assessment reporting that he slept "good" last night and had complaints of chronic back pain, in which he did not request anything other than his scheduled medications from this Clinical research associate. Patient endorsed depression, anxiety, and hopelessness on his self-inventory, stating that "being here" has him feeling this way. Patient denied SI, HI, AVH at this time. Patient's goal for today is to "go home". ? ?A- Scheduled medications administered to patient, per MD orders. Support and encouragement provided.  Routine safety checks conducted every 15 minutes.  Patient informed to notify staff with problems or concerns. ? ?R- No adverse drug reactions noted. Patient contracts for safety at this time. Patient compliant with medications and treatment plan. Patient receptive, calm, and cooperative. Patient interacts well with others on the unit.  Patient remains safe at this time. ? ?Problem: Education: ?Goal: Knowledge of [facility] Education information/materials will improve ?Outcome: Progressing ?Goal: Emotional status will improve ?Outcome: Progressing ?Goal: Mental status will improve ?Outcome: Progressing ?Goal: Verbalization of understanding the information provided will improve ?Outcome: Progressing ?  ?Problem: Health Behavior/Discharge Planning: ?Goal: Identification of resources available to assist in meeting health care needs will improve ?Outcome: Progressing ?Goal: Compliance with treatment plan for underlying cause of condition will improve ?Outcome: Progressing ?  ?Problem: Education: ?Goal: Utilization of techniques to improve thought processes will improve ?Outcome: Progressing ?Goal: Knowledge of the prescribed therapeutic regimen will improve ?Outcome: Progressing ?  ?Problem: Activity: ?Goal: Interest or engagement in leisure activities will improve ?Outcome: Progressing ?Goal: Imbalance in normal sleep/wake cycle will  improve ?Outcome: Progressing ?  ?Problem: Coping: ?Goal: Coping ability will improve ?Outcome: Progressing ?Goal: Will verbalize feelings ?Outcome: Progressing ?  ?Problem: Role Relationship: ?Goal: Will demonstrate positive changes in social behaviors and relationships ?Outcome: Progressing ?  ?Problem: Safety: ?Goal: Ability to disclose and discuss suicidal ideas will improve ?Outcome: Progressing ?Goal: Ability to identify and utilize support systems that promote safety will improve ?Outcome: Progressing ?  ?Problem: Self-Concept: ?Goal: Will verbalize positive feelings about self ?Outcome: Progressing ?Goal: Level of anxiety will decrease ?Outcome: Progressing ?  ?

## 2021-09-01 NOTE — Progress Notes (Signed)
Recreation Therapy Notes ? ?Date: 09/01/2021 ? ?Time: 11:00am    ? ?Location: Craft room  ? ?Behavioral response: N/A ?  ?Intervention Topic: Strengths  ? ?Discussion/Intervention: ?Patient refused to attend group.  ? ?Clinical Observations/Feedback:  ?Patient refused to attend group.  ?  ?Leyah Bocchino LRT/CTRS ? ? ? ? ? ? ? ?Darrelle Wiberg ?09/01/2021 12:11 PM ?

## 2021-09-01 NOTE — BH IP Treatment Plan (Signed)
Interdisciplinary Treatment and Diagnostic Plan Update ? ?09/01/2021 ?Time of Session: 0930 ?Jeffery Hubbard ?MRN: [id number] ? ?Principal Diagnosis: MDD (major depressive disorder) ? ?Secondary Diagnoses: Principal Problem: ?  MDD (major depressive disorder) ?Active Problems: ?  OSA on CPAP ?  Cannabis abuse ?  Chronic midline low back pain with right-sided sciatica ?  Chronic pain ?  Post-traumatic stress disorder, unspecified ?  Traumatic brain injury ? ? ?Current Medications:  ?Current Facility-Administered Medications  ?Medication Dose Route Frequency Provider Last Rate Last Admin  ? acetaminophen (TYLENOL) tablet 650 mg  650 mg Oral Q6H PRN Sherlon Handing, NP   650 mg at 08/31/21 1724  ? albuterol (PROVENTIL) (2.5 MG/3ML) 0.083% nebulizer solution 2.5 mg  2.5 mg Inhalation Q6H PRN Sherlon Handing, NP      ? alum & mag hydroxide-simeth (MAALOX/MYLANTA) 200-200-20 MG/5ML suspension 30 mL  30 mL Oral Q4H PRN Sherlon Handing, NP      ? aspirin EC tablet 81 mg  81 mg Oral Daily Waldon Merl F, NP   81 mg at 09/01/21 2707  ? atorvastatin (LIPITOR) tablet 80 mg  80 mg Oral Daily Waldon Merl F, NP   80 mg at 09/01/21 8675  ? baclofen (LIORESAL) tablet 10 mg  10 mg Oral Daily Waldon Merl F, NP   10 mg at 09/01/21 4492  ? buPROPion (WELLBUTRIN XL) 24 hr tablet 450 mg  450 mg Oral q morning Waldon Merl F, NP   450 mg at 09/01/21 0900  ? cholecalciferol (VITAMIN D) tablet 2,000 Units  2,000 Units Oral Daily Waldon Merl F, NP   2,000 Units at 09/01/21 0900  ? divalproex (DEPAKOTE ER) 24 hr tablet 1,000 mg  1,000 mg Oral QHS Waldon Merl F, NP   1,000 mg at 08/31/21 2123  ? magnesium hydroxide (MILK OF MAGNESIA) suspension 30 mL  30 mL Oral Daily PRN Waldon Merl F, NP      ? metoprolol succinate (TOPROL-XL) 24 hr tablet 25 mg  25 mg Oral QHS Sherlon Handing, NP   25 mg at 08/31/21 2123  ? mometasone-formoterol (DULERA) 100-5 MCG/ACT inhaler 2 puff  2 puff Inhalation BID Sherlon Handing, NP   2 puff at 08/31/21 2125  ? pantoprazole (PROTONIX) EC tablet 40 mg  40 mg Oral QAC breakfast Waldon Merl F, NP   40 mg at 09/01/21 0100  ? prazosin (MINIPRESS) capsule 2 mg  2 mg Oral QHS Sherlon Handing, NP   2 mg at 08/31/21 2123  ? pregabalin (LYRICA) capsule 200 mg  200 mg Oral TID Sherlon Handing, NP   200 mg at 09/01/21 7121  ? sertraline (ZOLOFT) tablet 200 mg  200 mg Oral QHS Waldon Merl F, NP   200 mg at 08/31/21 2122  ? ?PTA Medications: ?Medications Prior to Admission  ?Medication Sig Dispense Refill Last Dose  ? aspirin 81 MG EC tablet Take by mouth.     ? atorvastatin (LIPITOR) 80 MG tablet Take 80 mg by mouth daily.     ? baclofen (LIORESAL) 10 MG tablet Take 10 mg by mouth daily.     ? buPROPion HCl ER, XL, 450 MG TB24 Take 1 tablet by mouth every morning.     ? Cholecalciferol 50 MCG (2000 UT) CAPS Take 1 capsule by mouth daily.     ? divalproex (DEPAKOTE ER) 500 MG 24 hr tablet Take 1,000 mg at bedtime by mouth.  1   ? Fluticasone-Salmeterol (ADVAIR)  100-50 MCG/DOSE AEPB Inhale 1 puff 2 (two) times daily into the lungs.     ? metoprolol succinate (TOPROL-XL) 25 MG 24 hr tablet Take 25 mg at bedtime by mouth.  3   ? naloxone (NARCAN) nasal spray 4 mg/0.1 mL Place 1 spray into the nose as needed.     ? nitroGLYCERIN (NITROSTAT) 0.4 MG SL tablet Place 0.4 mg under the tongue every 5 (five) minutes as needed for chest pain.     ? pantoprazole (PROTONIX) 40 MG tablet Take 40 mg daily before breakfast by mouth.     ? prazosin (MINIPRESS) 2 MG capsule Take 2 mg at bedtime by mouth.  1   ? pregabalin (LYRICA) 200 MG capsule Take 200 mg by mouth 3 (three) times daily.     ? sertraline (ZOLOFT) 100 MG tablet Take 200 mg at bedtime by mouth.  1   ? VENTOLIN HFA 108 (90 Base) MCG/ACT inhaler Inhale 2 puffs every 6 (six) hours as needed into the lungs. For shortness of breath/wheezing.  1   ? ? ?Patient Stressors: Financial difficulties   ?Health problems   ?Medication change or  noncompliance   ?Traumatic event   ? ?Patient Strengths: Average or above average intelligence  ?Communication skills  ?Motivation for treatment/growth  ?Supportive family/friends  ? ?Treatment Modalities: Medication Management, Group therapy, Case management,  ?1 to 1 session with clinician, Psychoeducation, Recreational therapy. ? ? ?Physician Treatment Plan for Primary Diagnosis: MDD (major depressive disorder) ?Long Term Goal(s): Improvement in symptoms so as ready for discharge  ? ?Short Term Goals: Ability to maintain clinical measurements within normal limits will improve ?Compliance with prescribed medications will improve ?Ability to demonstrate self-control will improve ?Ability to identify and develop effective coping behaviors will improve ? ?Medication Management: Evaluate patient's response, side effects, and tolerance of medication regimen. ? ?Therapeutic Interventions: 1 to 1 sessions, Unit Group sessions and Medication administration. ? ?Evaluation of Outcomes: Not Met ? ?Physician Treatment Plan for Secondary Diagnosis: Principal Problem: ?  MDD (major depressive disorder) ?Active Problems: ?  OSA on CPAP ?  Cannabis abuse ?  Chronic midline low back pain with right-sided sciatica ?  Chronic pain ?  Post-traumatic stress disorder, unspecified ?  Traumatic brain injury ? ?Long Term Goal(s): Improvement in symptoms so as ready for discharge  ? ?Short Term Goals: Ability to maintain clinical measurements within normal limits will improve ?Compliance with prescribed medications will improve ?Ability to demonstrate self-control will improve ?Ability to identify and develop effective coping behaviors will improve    ? ?Medication Management: Evaluate patient's response, side effects, and tolerance of medication regimen. ? ?Therapeutic Interventions: 1 to 1 sessions, Unit Group sessions and Medication administration. ? ?Evaluation of Outcomes: Not Met ? ? ?RN Treatment Plan for Primary Diagnosis: MDD  (major depressive disorder) ?Long Term Goal(s): Knowledge of disease and therapeutic regimen to maintain health will improve ? ?Short Term Goals: Ability to remain free from injury will improve, Ability to verbalize frustration and anger appropriately will improve, Ability to demonstrate self-control, Ability to participate in decision making will improve, Ability to verbalize feelings will improve, Ability to disclose and discuss suicidal ideas, Ability to identify and develop effective coping behaviors will improve, and Compliance with prescribed medications will improve ? ?Medication Management: RN will administer medications as ordered by provider, will assess and evaluate patient's response and provide education to patient for prescribed medication. RN will report any adverse and/or side effects to prescribing provider. ? ?Therapeutic Interventions: 1  on 1 counseling sessions, Psychoeducation, Medication administration, Evaluate responses to treatment, Monitor vital signs and CBGs as ordered, Perform/monitor CIWA, COWS, AIMS and Fall Risk screenings as ordered, Perform wound care treatments as ordered. ? ?Evaluation of Outcomes: Not Met ? ? ?LCSW Treatment Plan for Primary Diagnosis: MDD (major depressive disorder) ?Long Term Goal(s): Safe transition to appropriate next level of care at discharge, Engage patient in therapeutic group addressing interpersonal concerns. ? ?Short Term Goals: Engage patient in aftercare planning with referrals and resources, Increase social support, Increase ability to appropriately verbalize feelings, Increase emotional regulation, Facilitate acceptance of mental health diagnosis and concerns, Facilitate patient progression through stages of change regarding substance use diagnoses and concerns, Identify triggers associated with mental health/substance abuse issues, and Increase skills for wellness and recovery ? ?Therapeutic Interventions: Assess for all discharge needs, 1 to 1  time with Social worker, Explore available resources and support systems, Assess for adequacy in community support network, Educate family and significant other(s) on suicide prevention, Complete Psychosocial A

## 2021-09-02 NOTE — Progress Notes (Signed)
Patient has been in the milieu for the majority of the shift. Attended SW group and was active.  ?

## 2021-09-02 NOTE — Progress Notes (Signed)
Patient alert and oriented x 4 affect is flat but brightens upon approach no distress noted. Patient denies SI/HI/AVH and compliant with medication regimen. 15 minutes safety checks maintained will continue to monitor.  ?

## 2021-09-02 NOTE — Plan of Care (Signed)
Patient is in and out of his room and cooperative. Anxious and  complaining of back/neck pain and receiving medications. Denying SI/HI. Denying hallucinations. Not engaged in group activities secondary to pain/discomfort. Encouragements and support provided. Safety precautions reinforced.  ?

## 2021-09-02 NOTE — Progress Notes (Signed)
Patient got up for medications and breakfast. Alert and oriented x 3. Rated his back pain at 7/10 and requested to use his stimulator.  Cooperative  but appears to have discomfort. Denied thoughts of self-harm. Reported that he is missing his family. Emotional support provided. Medications given and safety maintained.  ?
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
## 2021-09-03 MED ORDER — MUSCLE RUB 10-15 % EX CREA
TOPICAL_CREAM | CUTANEOUS | Status: DC | PRN
  Filled 2021-09-03: qty 85

## 2021-09-03 NOTE — Group Note (Signed)
[facility] LCSW Group Therapy Note ? ? ?Group Date: 09/03/2021 ?Start Time: 1300 ?End Time: 1400 ? ? ?Type of Therapy and Topic: Group Therapy: Avoiding Self-Sabotaging and Enabling Behaviors ? ?Participation Level: Did Not Attend ? ?Mood: ? ?Description of Group:  ?In this group, patients will learn how to identify obstacles, self-sabotaging and enabling behaviors, as well as: what are they, why do we do them and what needs these behaviors meet. Discuss unhealthy relationships and how to have positive healthy boundaries with those that sabotage and enable. Explore aspects of self-sabotage and enabling in yourself and how to limit these self-destructive behaviors in everyday life. ? ? ?Therapeutic Goals: ?1. Patient will identify one obstacle that relates to self-sabotage and enabling behaviors ?2. Patient will identify one personal self-sabotaging or enabling behavior they did prior to admission ?3. Patient will state a plan to change the above identified behavior ?4. Patient will demonstrate ability to communicate their needs through discussion and/or role play.  ? ? ?Summary of Patient Progress: Due to limited staffing, group was not held on the unit.  ? ? ? ? ?Therapeutic Modalities:  ?Cognitive Behavioral Therapy ?Person-Centered Therapy ?Motivational Interviewing ? ? ? ?Hildred Mollica K Alajiah Dutkiewicz, LCSWA ?

## 2021-09-03 NOTE — Progress Notes (Signed)
Patient refused his Dulera inhaler this morning, reporting that he did not need it at this time. ?

## 2021-09-03 NOTE — Progress Notes (Signed)
Patient was given PRN muscle rub cream for his neck. ?

## 2021-09-03 NOTE — Progress Notes (Signed)
Patient  requested his nerve stimulator after receiving his morning medication. Patient's tens unit was given to him and he stated that he would bring it back once he's finished. ?

## 2021-09-03 NOTE — Progress Notes (Signed)
Patient reports that he slept on his neck wrong Friday night and has been complaining of right-sided pain, that's radiating down to his elbow. Patient has already taken PRN Tylenol and is requesting an ice pack or another alternative. This Clinical research associate notified MD and new orders were placed for a muscle rub cream. ?
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
## 2021-09-03 NOTE — Plan of Care (Signed)
D- Patient alert and oriented. Patient presents in a pleasant mood on assessment reporting that he slept good last night, however, he feels that he slept on his neck wrong. Patient rated his pain level a "6/10", in which he did request PRN Tylenol from this Probation officer. Patient continues to endorse both depression and anxiety, rating them a "4/10" and "3/10". Patient states that "being in here, away from the kids" is why he's feeling this way. Patient denies SI, HI, AVH at this time. Patient's goal for today is to "cope with anger", in which he will "listen to meeting and take notes", in order to achieve his goal. ? ?A- Scheduled medications administered to patient, per MD orders. Support and encouragement provided.  Routine safety checks conducted every 15 minutes.  Patient informed to notify staff with problems or concerns. ? ?R- No adverse drug reactions noted. Patient contracts for safety at this time. Patient compliant with medications and treatment plan. Patient receptive, calm, and cooperative. Patient interacts well with others on the unit.  Patient remains safe at this time. ? ?Problem: Education: ?Goal: Knowledge of [facility] Education information/materials will improve ?Outcome: Progressing ?Goal: Emotional status will improve ?Outcome: Progressing ?Goal: Mental status will improve ?Outcome: Progressing ?Goal: Verbalization of understanding the information provided will improve ?Outcome: Progressing ?  ?Problem: Health Behavior/Discharge Planning: ?Goal: Identification of resources available to assist in meeting health care needs will improve ?Outcome: Progressing ?Goal: Compliance with treatment plan for underlying cause of condition will improve ?Outcome: Progressing ?  ?Problem: Education: ?Goal: Utilization of techniques to improve thought processes will improve ?Outcome: Progressing ?Goal: Knowledge of the prescribed therapeutic regimen will improve ?Outcome: Progressing ?  ?Problem:  Activity: ?Goal: Interest or engagement in leisure activities will improve ?Outcome: Progressing ?Goal: Imbalance in normal sleep/wake cycle will improve ?Outcome: Progressing ?  ?Problem: Coping: ?Goal: Coping ability will improve ?Outcome: Progressing ?Goal: Will verbalize feelings ?Outcome: Progressing ?  ?Problem: Role Relationship: ?Goal: Will demonstrate positive changes in social behaviors and relationships ?Outcome: Progressing ?  ?Problem: Safety: ?Goal: Ability to disclose and discuss suicidal ideas will improve ?Outcome: Progressing ?Goal: Ability to identify and utilize support systems that promote safety will improve ?Outcome: Progressing ?  ?Problem: Self-Concept: ?Goal: Will verbalize positive feelings about self ?Outcome: Progressing ?Goal: Level of anxiety will decrease ?Outcome: Progressing ?  ?

## 2021-09-03 NOTE — Progress Notes (Signed)
Patient brought his tens unit back to the nurses station and this writer put it back onto the charger. ?

## 2021-09-03 NOTE — Progress Notes (Signed)
Patient came up to this writer and stated that he was going down to have dinner. Patient previously reported that he has not eaten anything in the past few days. ?
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
## 2021-09-04 DIAGNOSIS — F331 Major depressive disorder, recurrent, moderate: Secondary | ICD-10-CM | POA: Diagnosis not present

## 2021-09-04 NOTE — Group Note (Signed)
[facility] LCSW Group Therapy Note ? ? ? ?Group Date: 09/04/2021 ?Start Time: 1330 ?End Time: 1430 ? ?Type of Therapy and Topic:  Group Therapy:  Overcoming Obstacles ? ?Participation Level:  [facility] PARTICIPATION LEVEL: Active ? ?Mood: ? ?Description of Group:   ?In this group patients will be encouraged to explore what they see as obstacles to their own wellness and recovery. They will be guided to discuss their thoughts, feelings, and behaviors related to these obstacles. The group will process together ways to cope with barriers, with attention given to specific choices patients can make. Each patient will be challenged to identify changes they are motivated to make in order to overcome their obstacles. This group will be process-oriented, with patients participating in exploration of their own experiences as well as giving and receiving support and challenge from other group members. ? ?Therapeutic Goals: ?1. Patient will identify personal and current obstacles as they relate to admission. ?2. Patient will identify barriers that currently interfere with their wellness or overcoming obstacles.  ?3. Patient will identify feelings, thought process and behaviors related to these barriers. ?4. Patient will identify two changes they are willing to make to overcome these obstacles:  ? ? ?Summary of Patient Progress ?Patient was present in group. Patient was an active participant. Patient was receptive to others.  Patient was responsive and engaged in discussion.  Patient appeared to be appropriate throughout group.  ? ? ?Therapeutic Modalities:   ?Cognitive Behavioral Therapy ?Solution Focused Therapy ?Motivational Interviewing ?Relapse Prevention Therapy ? ? ?Harden Mo, LCSW ?

## 2021-09-04 NOTE — Progress Notes (Signed)
Recreation Therapy Notes ? ? ?Date: 09/04/2021 ? ?Time: 10:35am    ? ?Location: Court yard   ? ?Behavioral response: N/A ?  ?Intervention Topic: Leisure   ? ?Discussion/Intervention: ?Patient refused to attend group.  ? ?Clinical Observations/Feedback:  ?Patient refused to attend group.  ?  ?Shanica Castellanos LRT/CTRS ? ? ? ? ? ? ? ? ? ?Meyer Arora ?09/04/2021 12:34 PM ?

## 2021-09-04 NOTE — Progress Notes (Signed)
Patient alert and oriented this evening. Resting in bed during night medications. Denies SI/HI/AVH. Rates anxiety 5/10 and depression 6/10. Complains of chronic pain. Rates 7/10 to right hip, back and leg. See assessment for details. Patient has a stimulator on the right side to assist with managing pain. Reports using it once a day. Currently has ice pack in place to reduce pain.  ? ?Takes all medication as prescribed. No adverse reactions noted. Cont Q15 minute check for safety.  ?

## 2021-09-04 NOTE — Plan of Care (Signed)
D: Pt alert and oriented. Pt rates depression 3/10, hopelessness 0/10, and anxiety 2/10. Pt goal: "Coping with anger." Pt reports concentration as being good. Pt reports sleep last night as being fair. Pt did not receive medications for sleep. Pt denies experiencing any pain at this time. Pt denies experiencing any SI/HI, or AVH at this time.  ? ? ?A: Scheduled medications administered to pt, per MD orders. Support and encouragement provided. Frequent verbal contact made. Routine safety checks conducted q15 minutes.  ? ?R: No adverse drug reactions noted. Pt verbally contracts for safety at this time. Pt compliant with medications. Pt interacts minimally with others on the unit. Pt remains safe at this time. Will continue to monitor. ?  ?Problem: Education: ?Goal: Knowledge of [facility] Education information/materials will improve ?Outcome: Progressing ?  ?Problem: Education: ?Goal: Knowledge of the prescribed therapeutic regimen will improve ?Outcome: Progressing ?  ?

## 2021-09-04 NOTE — Progress Notes (Signed)
Patient alert and oriented x 4 affect is flat but brightens upon approach no distress noted. Patient denies SI/HI/AVH and compliant with medication regimen. Patient clo of right shoulder pain was given ordered cream for relief. 15 minutes safety checks maintained will continue to monitor.  ?
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
## 2021-09-04 NOTE — Plan of Care (Signed)
?  Problem: Education: ?Goal: Knowledge of [facility] Education information/materials will improve ?Outcome: Progressing ?Goal: Emotional status will improve ?Outcome: Progressing ?Goal: Mental status will improve ?Outcome: Progressing ?Goal: Verbalization of understanding the information provided will improve ?Outcome: Progressing ?  ?Problem: Health Behavior/Discharge Planning: ?Goal: Identification of resources available to assist in meeting health care needs will improve ?Outcome: Progressing ?Goal: Compliance with treatment plan for underlying cause of condition will improve ?Outcome: Progressing ?  ?Problem: Education: ?Goal: Utilization of techniques to improve thought processes will improve ?Outcome: Progressing ?Goal: Knowledge of the prescribed therapeutic regimen will improve ?Outcome: Progressing ?  ?Problem: Activity: ?Goal: Interest or engagement in leisure activities will improve ?Outcome: Progressing ?Goal: Imbalance in normal sleep/wake cycle will improve ?Outcome: Progressing ?  ?Problem: Coping: ?Goal: Coping ability will improve ?Outcome: Progressing ?Goal: Will verbalize feelings ?Outcome: Progressing ?  ?Problem: Role Relationship: ?Goal: Will demonstrate positive changes in social behaviors and relationships ?Outcome: Progressing ?  ?Problem: Safety: ?Goal: Ability to disclose and discuss suicidal ideas will improve ?Outcome: Progressing ?Goal: Ability to identify and utilize support systems that promote safety will improve ?Outcome: Progressing ?  ?Problem: Self-Concept: ?Goal: Will verbalize positive feelings about self ?Outcome: Progressing ?Goal: Level of anxiety will decrease ?Outcome: Progressing ?  ?
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
## 2021-09-05 ENCOUNTER — Encounter: Payer: Self-pay | Admitting: Psychiatry

## 2021-09-05 DIAGNOSIS — F331 Major depressive disorder, recurrent, moderate: Secondary | ICD-10-CM | POA: Diagnosis not present

## 2021-09-05 MED ORDER — VITAMIN D3 25 MCG (1000 UNIT) PO TABS: 2000.0000 [IU] | ORAL_TABLET | Freq: Every day | ORAL | 0 refills | Status: DC

## 2021-09-05 MED ORDER — SERTRALINE HCL 100 MG PO TABS: 200.0000 mg | ORAL_TABLET | Freq: Every day | ORAL | 0 refills | Status: DC

## 2021-09-05 MED ORDER — PANTOPRAZOLE SODIUM 40 MG PO TBEC: 40.0000 mg | DELAYED_RELEASE_TABLET | Freq: Every day | ORAL | 0 refills | Status: AC

## 2021-09-05 MED ORDER — NALOXONE HCL 4 MG/0.1ML NA LIQD: 1.0000 | NASAL | 0 refills | Status: DC | PRN

## 2021-09-05 MED ORDER — VENTOLIN HFA 108 (90 BASE) MCG/ACT IN AERS: 2.0000 | INHALATION_SPRAY | Freq: Four times a day (QID) | RESPIRATORY_TRACT | 0 refills | Status: AC | PRN

## 2021-09-05 MED ORDER — PRAZOSIN HCL 2 MG PO CAPS
2.0000 mg | ORAL_CAPSULE | Freq: Every day | ORAL | 1 refills | Status: DC
Start: 2021-09-05 — End: 2022-05-15

## 2021-09-05 MED ORDER — PREGABALIN 200 MG PO CAPS: 200.0000 mg | ORAL_CAPSULE | Freq: Three times a day (TID) | ORAL | 0 refills | Status: AC

## 2021-09-05 MED ORDER — BACLOFEN 10 MG PO TABS: 10.0000 mg | ORAL_TABLET | Freq: Every day | ORAL | 0 refills | Status: AC

## 2021-09-05 MED ORDER — BUPROPION HCL ER (XL) 450 MG PO TB24
450.0000 mg | ORAL_TABLET | Freq: Every morning | ORAL | 0 refills | Status: DC
Start: 2021-09-06 — End: 2022-05-15

## 2021-09-05 MED ORDER — DIVALPROEX SODIUM ER 500 MG PO TB24
1000.0000 mg | ORAL_TABLET | Freq: Every day | ORAL | 0 refills | Status: DC
Start: 2021-09-05 — End: 2022-05-15

## 2021-09-05 MED ORDER — ATORVASTATIN CALCIUM 80 MG PO TABS: 80.0000 mg | ORAL_TABLET | Freq: Every day | ORAL | 0 refills | Status: AC

## 2021-09-05 MED ORDER — MOMETASONE FURO-FORMOTEROL FUM 100-5 MCG/ACT IN AERO: 2.0000 | INHALATION_SPRAY | Freq: Two times a day (BID) | RESPIRATORY_TRACT | 0 refills | Status: DC

## 2021-09-05 MED ORDER — METOPROLOL SUCCINATE ER 25 MG PO TB24: 25.0000 mg | ORAL_TABLET | Freq: Every day | ORAL | 0 refills | Status: DC

## 2021-09-05 MED ORDER — ASPIRIN 81 MG PO TBEC: 81.0000 mg | DELAYED_RELEASE_TABLET | Freq: Every day | ORAL | 0 refills | Status: AC

## 2021-09-05 NOTE — Care Management Important Message (Signed)
Important Message ? ?Patient Details  ?Name: Jeffery Hubbard ?MRN: [id number] ?Date of Birth: 06-16-1975 ? ? ?Medicare Important Message Given:  Yes ? ?Patient informed of right to appeal discharge, provided phone number to [facility]. Patient expressed no interest in appealing discharge at this time. CSW will continue to monitor situation. ? ? ?Durenda Hurt, LCSWA ?09/05/2021, 9:45 AM ?

## 2021-09-05 NOTE — [facility] Suicide Risk Assessment (Signed)
[facility] Discharge Suicide Risk Assessment ? ? ?Principal Problem: MDD (major depressive disorder) ?Discharge Diagnoses: Principal Problem: ?  MDD (major depressive disorder) ?Active Problems: ?  OSA on CPAP ?  Cannabis abuse ?  Chronic midline low back pain with right-sided sciatica ?  Chronic pain ?  Post-traumatic stress disorder, unspecified ?  Traumatic brain injury ? ? ?Total Time spent with patient: 30 minutes ? ?Musculoskeletal: ?Strength & Muscle Tone: within normal limits ?Gait & Station: normal ?Patient leans: N/A ? ?Psychiatric Specialty Exam ? ?Presentation  ?General Appearance: Appropriate for Environment; Disheveled; Neat ? ?Eye Contact:Fair ? ?Speech:Slow ? ?Speech Volume:Decreased ? ?Handedness:Right ? ? ?Mood and Affect  ?Mood:Anxious; Depressed ? ?Duration of Depression Symptoms: Greater than two weeks ? ?Affect:Constricted ? ? ?Thought Process  ?Thought Processes:Coherent ? ?Descriptions of Associations:Intact ? ?Orientation:Full (Time, Place and Person) ? ?Thought Content:Abstract Reasoning; Logical ? ?History of Schizophrenia/Schizoaffective disorder:No ? ?Duration of Psychotic Symptoms:No data recorded ?Hallucinations:No data recorded ?Ideas of Reference:None ? ?Suicidal Thoughts:No data recorded ?Homicidal Thoughts:No data recorded ? ?Sensorium  ?Memory:Immediate Fair ? ?Judgment:Fair ? ?Insight:Fair ? ? ?Executive Functions  ?Concentration:Fair ? ?Attention Span:Fair ? ?Recall:Fair ? ?[facility] ? ?Language:Fair ? ? ?Psychomotor Activity  ?Psychomotor Activity:No data recorded ? ?Assets  ?Assets:Communication Skills; Desire for Improvement; Physical Health; Social Support ? ? ?Sleep  ?Sleep:No data recorded ? ?Physical Exam: ?Physical Exam ?Vitals and nursing note reviewed.  ?Constitutional:   ?   Appearance: Normal appearance.  ?HENT:  ?   Head: Normocephalic and atraumatic.  ?   Mouth/Throat:  ?   Pharynx: Oropharynx is clear.  ?Eyes:  ?   Pupils: Pupils are equal, round, and  reactive to light.  ?Cardiovascular:  ?   Rate and Rhythm: Normal rate and regular rhythm.  ?Pulmonary:  ?   Effort: Pulmonary effort is normal.  ?   Breath sounds: Normal breath sounds.  ?Abdominal:  ?   General: Abdomen is flat.  ?   Palpations: Abdomen is soft.  ?Musculoskeletal:     ?   General: Normal range of motion.  ?Skin: ?   General: Skin is warm and dry.  ?Neurological:  ?   General: No focal deficit present.  ?   Mental Status: He is alert. Mental status is at baseline.  ?Psychiatric:     ?   Attention and Perception: Attention normal.     ?   Mood and Affect: Mood is anxious. Affect is not blunt.     ?   Speech: Speech normal.     ?   Behavior: Behavior is cooperative.     ?   Thought Content: Thought content normal.     ?   Cognition and Memory: Cognition is impaired.  ? ?Review of Systems  ?Constitutional: Negative.   ?HENT: Negative.    ?Eyes: Negative.   ?Respiratory: Negative.    ?Cardiovascular: Negative.   ?Gastrointestinal: Negative.   ?Musculoskeletal: Negative.   ?Skin: Negative.   ?Neurological: Negative.   ?Psychiatric/Behavioral:  Negative for depression, hallucinations, substance abuse and suicidal ideas. The patient is not nervous/anxious and does not have insomnia.   ?Blood pressure 106/80, pulse 77, temperature 97.9 ?F (36.6 ?C), temperature source Oral, resp. rate 18, height 5\' 8"  (1.727 m), weight 93 kg, SpO2 98 %. Body mass index is 31.17 kg/m?. ? ?Mental Status Per Nursing Assessment::   ?On Admission:  NA ? ?Demographic Factors:  ?Male, Caucasian, and Unemployed ? ?Loss Factors: ?Loss of significant relationship and Decline in physical health ? ?  Historical Factors: ?Impulsivity ? ?Risk Reduction Factors:   ?Living with another person, especially a relative, Positive social support, and Positive therapeutic relationship ? ?Continued Clinical Symptoms:  ?Alcohol/Substance Abuse/Dependencies ?Medical Diagnoses and Treatments/Surgeries ? ?Cognitive Features That Contribute To Risk:   ?Loss of executive function   ? ?Suicide Risk:  ?Minimal: No identifiable suicidal ideation.  Patients presenting with no risk factors but with morbid ruminations; may be classified as minimal risk based on the severity of the depressive symptoms ? ? Follow-up Information   ? ? Psychiatry, [place] Department Of. Go on 10/06/2021.   ?Why: Please present for scheduled appointment on 06 Oct 2021 at 1000AM. ?Contact information: ?[address] ?[place] [address] ?[phone] ? ? ?  ?  ? ?  ?  ? ?  ? ? ?Plan Of Care/Follow-up recommendations:  ?Patient is to follow up with his primary psychiatrist at [facility].  He is strongly advised to avoid cannabis or other intoxicating drugs and to maintain compliance with recommended prescribed psychiatric medicine.  Patient denies all suicidal ideation and expresses positive plans for the future and shows a euthymic and appropriate affect ? ?Alethia Berthold, MD ?09/05/2021, 10:38 AM ?

## 2021-09-05 NOTE — Progress Notes (Signed)
D: Pt alert and oriented. Pt denies experiencing any pain, SI/HI, or AVH at this time. Pt reports he will be able to keep himself safe when he returns home.  ? ?A: Pt received discharge and medication education/information. Pt belongings were returned and signed for at this time to include printed prescriptions, and stimulator for pain.  ? ?R: Pt verbalized understanding of discharge and medication education/information. ? ?Pt escorted by staff to medical mall front lobby where pt's mother picked him up.  ?

## 2021-09-05 NOTE — Group Note (Unsigned)
[facility] LCSW Group Therapy Note ? ? ?Group Date: 09/05/2021 ?Start Time: 1300 ?End Time: 1400 ? ?Type of Therapy/Topic:  Group Therapy:  Feelings about Diagnosis ? ?Participation Level:  {[facility] PARTICIPATION LEVEL:22264}  ? ?Mood: *** ? ? ?Description of Group:   ? This group will allow patients to explore their thoughts and feelings about diagnoses they have received. Patients will be guided to explore their level of understanding and acceptance of these diagnoses. Facilitator will encourage patients to process their thoughts and feelings about the reactions of others to their diagnosis, and will guide patients in identifying ways to discuss their diagnosis with significant others in their lives. This group will be process-oriented, with patients participating in exploration of their own experiences as well as giving and receiving support and challenge from other group members. ? ? ?Therapeutic Goals: ?1. Patient will demonstrate understanding of diagnosis as evidence by identifying two or more symptoms of the disorder:  ?2. Patient will be able to express two feelings regarding the diagnosis ?3. Patient will demonstrate ability to communicate their needs through discussion and/or role plays ? ?Summary of Patient Progress: ? ? ? ?*** ? ? ? ?Therapeutic Modalities:   ?Cognitive Behavioral Therapy ?Brief Therapy ?Feelings Identification  ? ? ?Jenai Scaletta J Malcom Selmer, LCSW ?

## 2021-09-05 NOTE — Progress Notes (Signed)
?  [facility] Adult Case Management Discharge Plan : ? ?Will you be returning to the same living situation after discharge:  No. Patient to reside with his mother due to pending protective order placed by spouse. Patient informed of 50B and instructed to not return to his home.  ? ?At discharge, do you have transportation home?: Yes,  Patient's mother to assist with transportation.  ? ?Do you have the ability to pay for your medications: Yes,  Medicare Part A and B Patient informed of right to appeal discharge, provided phone number to [facility]. Patient expressed no interest in appealing discharge at this time. CSW will continue to monitor situation. ? ?Release of information consent forms completed and in the chart;  Patient's signature needed at discharge. ? ?Patient to Follow up at: ? Follow-up Information   ? ? Psychiatry, [facility] Department Of. Go on 10/06/2021.   ?Why: Please present for scheduled appointment on 06 Oct 2021 at 1000AM. ?Contact information: ?[address] ?[place] [address] ?[phone] ? ? ?  ?  ? ?  ?  ? ?  ?  ? ? ?Next level of care provider has access to [facility] Link:no ? ?Safety Planning and Suicide Prevention discussed: Yes,  SPE completed with patient, spouse refused to support patient afterdischarge.  ?  ?Has patient been referred to the Quitline?: Patient refused referral Patient denied active tobacco use, per nurse admission, patient has been determined to be moderate use of tobacco related ailment (see SDH section).  ? ?Patient has been referred for addiction treatment: Pt. refused referral Patient reports active cannabis use, declined treatment for such use.  ? ?Corky Crafts, LCSWA ?09/05/2021, 9:46 AM ?
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
## 2021-09-05 NOTE — Progress Notes (Signed)
[facility] LCSW Progress Note ?  ?09/05/2021    9:16 AM ? ?QUANTRELL SPLITT  ? ?[id number]  ? ?Type of Contact and Topic: Collateral Contact  ? ?CSW attempted to reach out to Malcolm Quast, spouse, at [phone] with written consent from patient, in order to evaluate safety risks outlined in [place], LCSWA note dated 09/03/2021.  ? ?CSW confirmed with Camie Patience, she has been granted 50B protective order preventing him from returning home. CSW informed her the patient is likely to discharge to his mother's home which is adjacent to the property. Mother's home is on 7 acres and spouse states it is not included in the proximal distance listed on the protective order.  ? ?CSW spoke with patient and informed him of the pending protective order. CSW encouraged patient to contact his mother to confirm he can reside there at least until the hearing for the protective order.  ? ?Patient acknowledges and is observed on phone at this time.  ? ?Situation ongoing, CSW will continue to monitor and update note as more information becomes available.  ? ?  ?Signed:  ?Corky Crafts, MSW, LCSWA, LCAS ?09/05/2021 9:16 AM ?  ?  ?

## 2021-09-05 NOTE — Progress Notes (Signed)
Recreation Therapy Notes ? ?INPATIENT RECREATION TR PLAN ? ?Patient Details ?Name: Jeffery Hubbard ?MRN: [id number] ?DOB: May 06, 1976 ?Today's Date: 09/05/2021 ? ?Rec Therapy Plan ?Is patient appropriate for Therapeutic Recreation?: Yes ?Treatment times per week: at least 3 ?Estimated Length of Stay: 5-7 days ?TR Treatment/Interventions: Group participation (Comment) ? ?Discharge Criteria ?Pt will be discharged from therapy if:: Discharged ?Treatment plan/goals/alternatives discussed and agreed upon by:: Patient/family ? ?Discharge Summary ?Short term goals set: Patient will engage in groups without prompting or encouragement from LRT x3 group sessions within 5 recreation therapy group sessions ?Short term goals met: Not met ?Reason goals not met: Patient did not attend any groups ?Therapeutic equipment acquired: N/A ?Reason patient discharged from therapy: Discharge from hospital ?Pt/family agrees with progress & goals achieved: Yes ?Date patient discharged from therapy: 09/05/21 ? ? ?Tahisha Hakim ?09/05/2021, 12:01 PM ?

## 2021-09-05 NOTE — Plan of Care (Signed)
D: Pt alert and oriented. Pt rates depression 3/10, hopelessness 0/10, and anxiety 2/10. Pt goal: "Coping with Anger." Pt reports energy level as low and concentration as being good. Pt reports sleep last night as being fair. Pt did not receive medications for sleep. Pt reports experiencing chronic lower back and right hip pain at this time. Pt denies experiencing any SI/HI, or AVH at this time.  ? ?A: Scheduled medications administered to pt, per MD orders. Support and encouragement provided. Frequent verbal contact made. Routine safety checks conducted q15 minutes.  ? ?R: No adverse drug reactions noted. Pt verbally contracts for safety at this time. Pt compliant with medications. Pt interacts minimally with others on the unit. Pt remains safe at this time. Will continue to monitor.  ? ?Problem: Education: ?Goal: Emotional status will improve ?Outcome: Progressing ?Goal: Mental status will improve ?Outcome: Progressing ?  ?

## 2021-09-05 NOTE — Plan of Care (Signed)
?  Problem: Group Participation ?Goal: STG - Patient will engage in groups without prompting or encouragement from LRT x3 group sessions within 5 recreation therapy group sessions ?Description: STG - Patient will engage in groups without prompting or encouragement from LRT x3 group sessions within 5 recreation therapy group sessions ?09/05/2021 1200 by Ernest Haber, LRT ?Outcome: Not Applicable ?09/05/2021 1159 by Ernest Haber, LRT ?Outcome: Not Met (add Reason) ?Note: Patient did not attend any groups.  ?  ?

## 2021-09-05 NOTE — Progress Notes (Signed)
Recreation Therapy Notes ? ?Date: 09/05/2021 ? ?Time: 10:20 am   ? ?Location: Craft room   ? ?Behavioral response: N/A ?  ?Intervention Topic: Time Management  ? ?Discussion/Intervention: ?Patient refused to attend group.  ? ?Clinical Observations/Feedback:  ?Patient refused to attend group.  ?  ?Kaliyah Gladman LRT/CTRS ? ? ? ? ? ? ? ? ?Kaipo Ardis ?09/05/2021 12:10 PM ?

## 2021-10-02 DIAGNOSIS — G252 Other specified forms of tremor: Secondary | ICD-10-CM | POA: Insufficient documentation

## 2022-05-10 ENCOUNTER — Ambulatory Visit (HOSPITAL_COMMUNITY): Payer: Medicare Other | Admitting: Psychiatry

## 2022-05-15 ENCOUNTER — Encounter (HOSPITAL_COMMUNITY): Payer: Self-pay | Admitting: Psychiatry

## 2022-05-15 ENCOUNTER — Ambulatory Visit (INDEPENDENT_AMBULATORY_CARE_PROVIDER_SITE_OTHER): Payer: Medicare Other | Admitting: Psychiatry

## 2022-05-15 DIAGNOSIS — F1210 Cannabis abuse, uncomplicated: Secondary | ICD-10-CM

## 2022-05-15 DIAGNOSIS — G894 Chronic pain syndrome: Secondary | ICD-10-CM

## 2022-05-15 DIAGNOSIS — F332 Major depressive disorder, recurrent severe without psychotic features: Secondary | ICD-10-CM

## 2022-05-15 DIAGNOSIS — F063 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, unspecified: Secondary | ICD-10-CM

## 2022-05-15 DIAGNOSIS — G250 Essential tremor: Secondary | ICD-10-CM | POA: Insufficient documentation

## 2022-05-15 DIAGNOSIS — Z79899 Other long term (current) drug therapy: Secondary | ICD-10-CM

## 2022-05-15 DIAGNOSIS — F4310 Post-traumatic stress disorder, unspecified: Secondary | ICD-10-CM

## 2022-05-15 DIAGNOSIS — S069X9S Unspecified intracranial injury with loss of consciousness of unspecified duration, sequela: Secondary | ICD-10-CM | POA: Diagnosis not present

## 2022-05-15 DIAGNOSIS — F331 Major depressive disorder, recurrent, moderate: Secondary | ICD-10-CM

## 2022-05-15 MED ORDER — SERTRALINE HCL 100 MG PO TABS: 200.0000 mg | ORAL_TABLET | Freq: Every day | ORAL | 2 refills | Status: DC

## 2022-05-15 MED ORDER — PRAZOSIN HCL 2 MG PO CAPS: 2.0000 mg | ORAL_CAPSULE | Freq: Every day | ORAL | 1 refills | Status: DC

## 2022-05-15 MED ORDER — DIVALPROEX SODIUM ER 500 MG PO TB24: 1000.0000 mg | ORAL_TABLET | Freq: Every day | ORAL | 2 refills | Status: DC

## 2022-05-15 MED ORDER — BUPROPION HCL ER (XL) 450 MG PO TB24: 450.0000 mg | ORAL_TABLET | Freq: Every morning | ORAL | 2 refills | Status: DC
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
## 2022-06-15 ENCOUNTER — Telehealth (HOSPITAL_COMMUNITY): Payer: Medicare Other | Admitting: Psychiatry

## 2022-06-18 ENCOUNTER — Telehealth (HOSPITAL_COMMUNITY): Payer: Medicare Other | Admitting: Psychiatry

## 2022-06-28 ENCOUNTER — Telehealth (HOSPITAL_COMMUNITY): Payer: Medicare Other | Admitting: Psychiatry

## 2022-08-03 ENCOUNTER — Telehealth (HOSPITAL_COMMUNITY): Payer: Medicare Other | Admitting: Psychiatry

## 2022-08-31 ENCOUNTER — Other Ambulatory Visit (HOSPITAL_COMMUNITY): Payer: Self-pay | Admitting: Psychiatry

## 2022-08-31 DIAGNOSIS — F063 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, unspecified: Secondary | ICD-10-CM

## 2022-08-31 DIAGNOSIS — S069X9S Unspecified intracranial injury with loss of consciousness of unspecified duration, sequela: Secondary | ICD-10-CM

## 2022-08-31 DIAGNOSIS — F331 Major depressive disorder, recurrent, moderate: Secondary | ICD-10-CM

## 2022-08-31 DIAGNOSIS — F4310 Post-traumatic stress disorder, unspecified: Secondary | ICD-10-CM

## 2022-09-06 ENCOUNTER — Telehealth (HOSPITAL_COMMUNITY): Payer: Medicare Other | Admitting: Psychiatry

## 2022-09-13 IMAGING — US US EXTREM  UP VENOUS*R*
1 series · 13 of 24 positions shown · non-contrast
Comparison: None.

CLINICAL DATA: 44-year-old female with right arm pain



[Series 1: us extrem up venous*right* · 0.07mm/px · 13 of 32 slices shown]
[im 1/32]
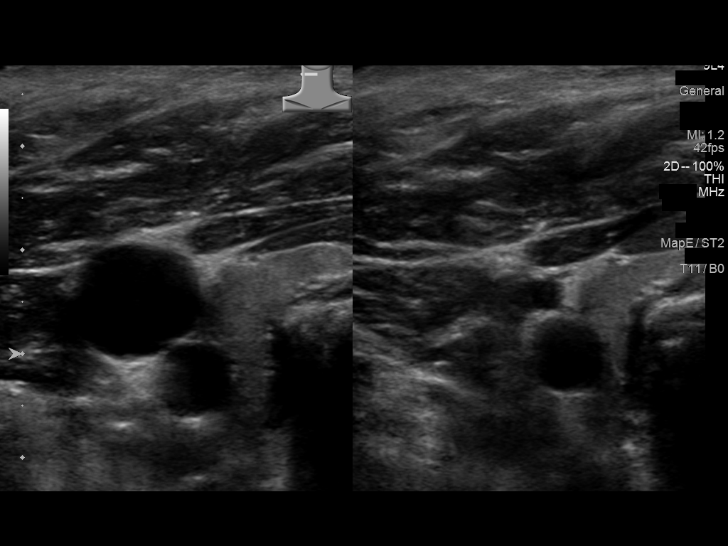
[im 3/32]
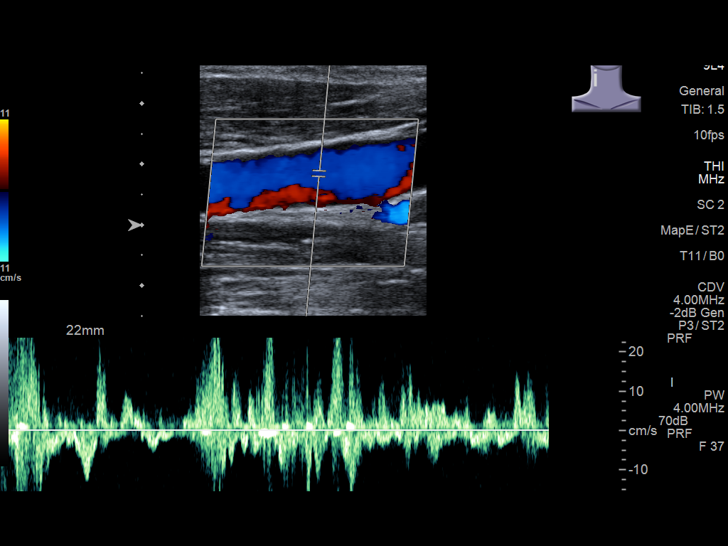
[im 6/32]
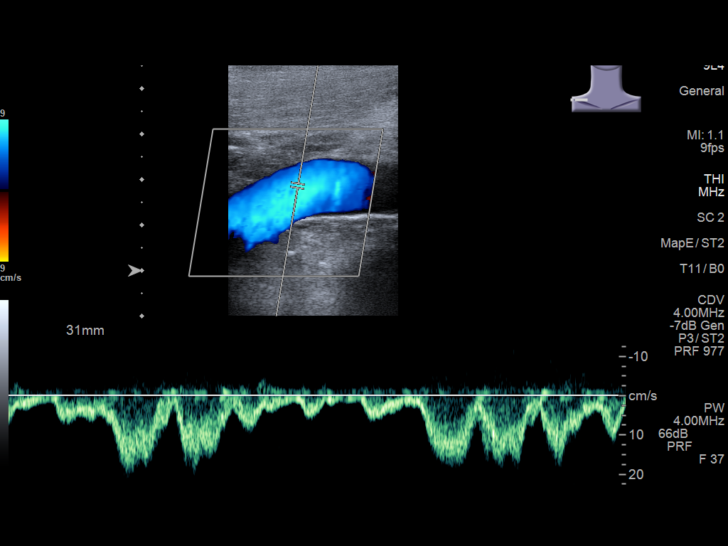
[im 9/32]
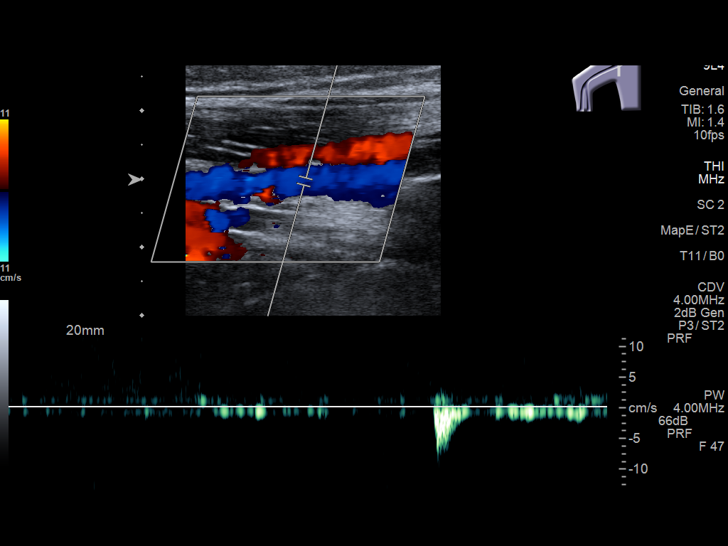
[im 11/32]
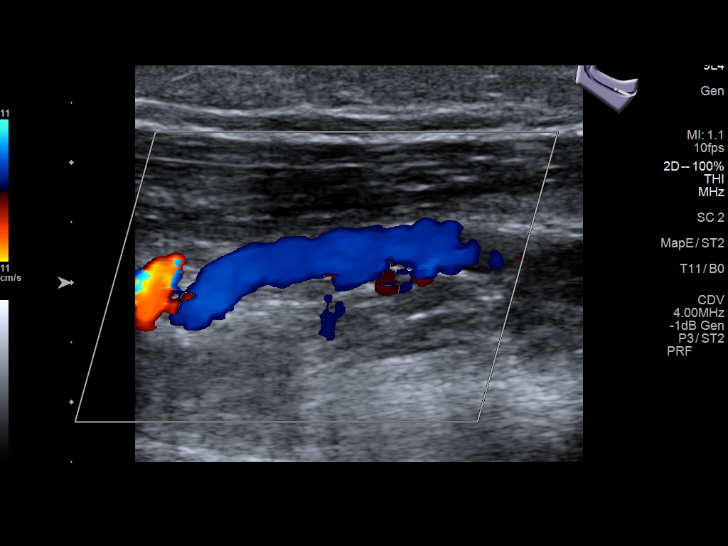
[im 14/32]
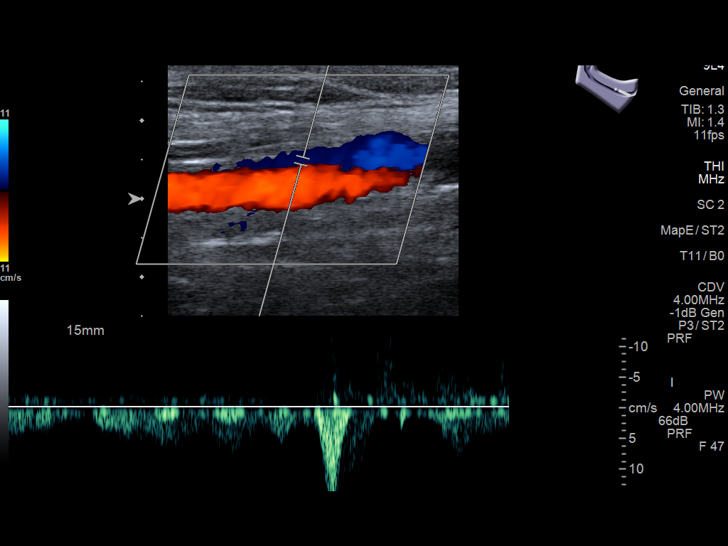
[im 17/32]
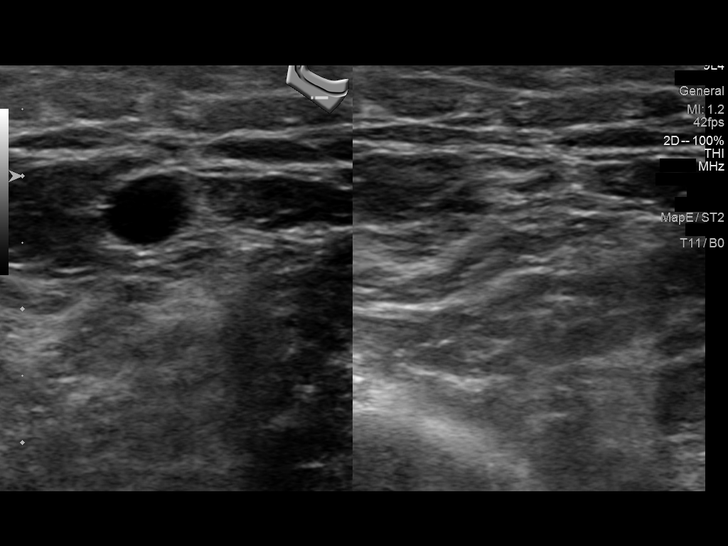
[im 18/32]
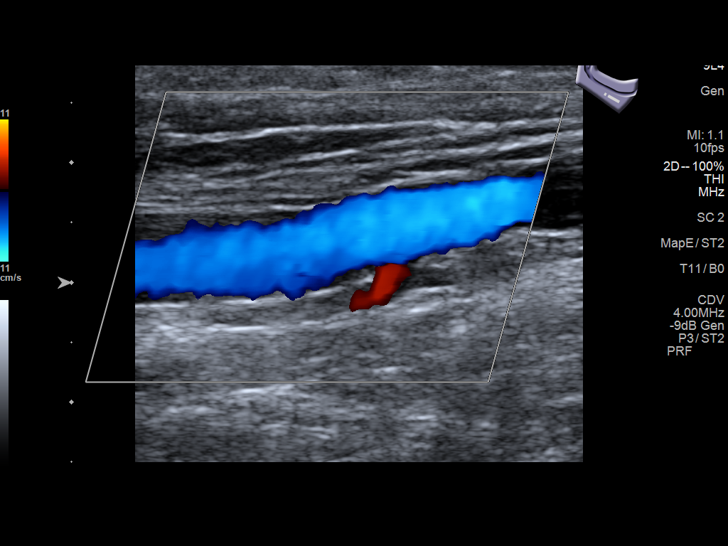
[im 21/32]
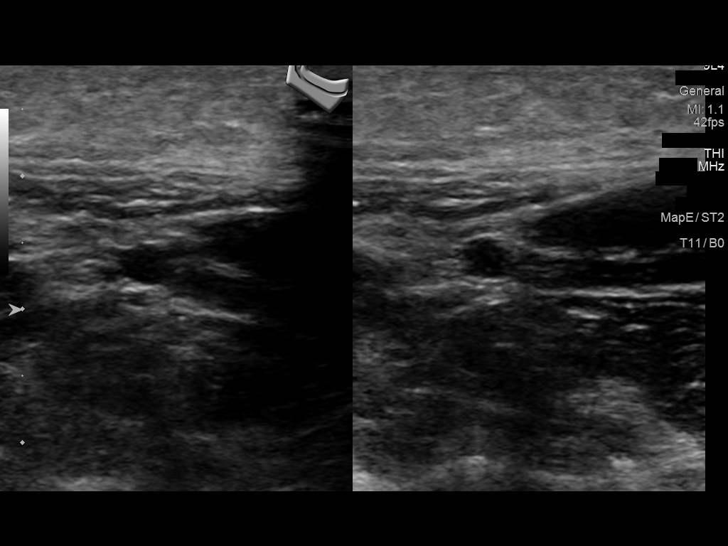
[im 23/32]
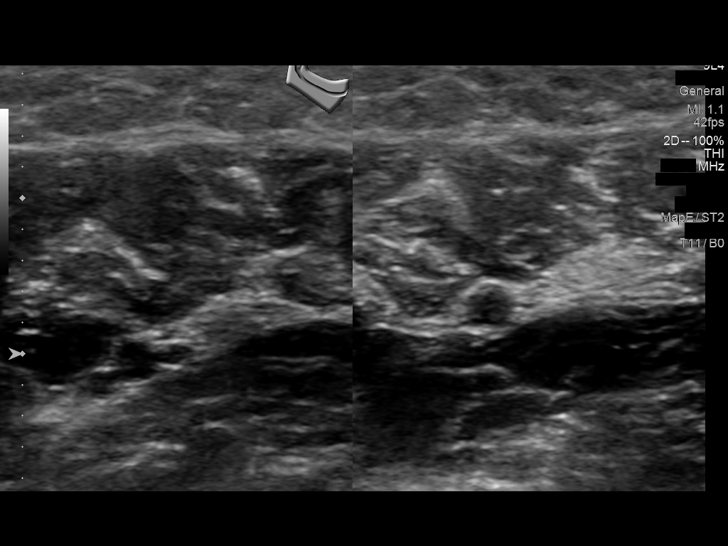
[im 26/32]
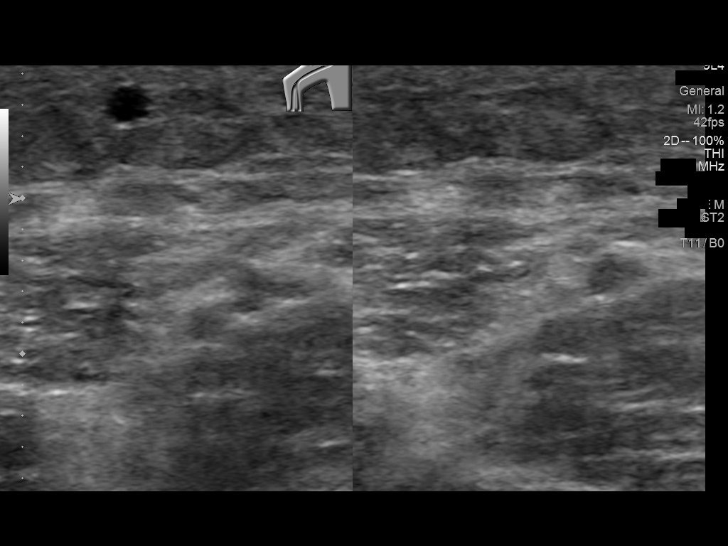
[im 29/32]
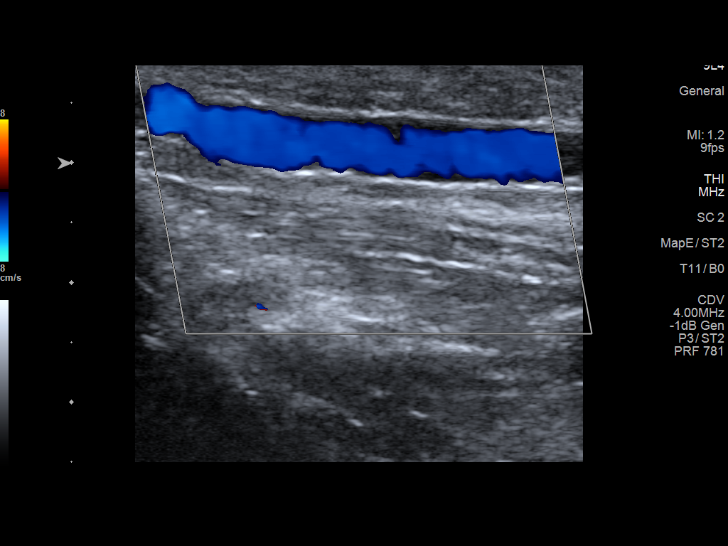
[im 32/32]
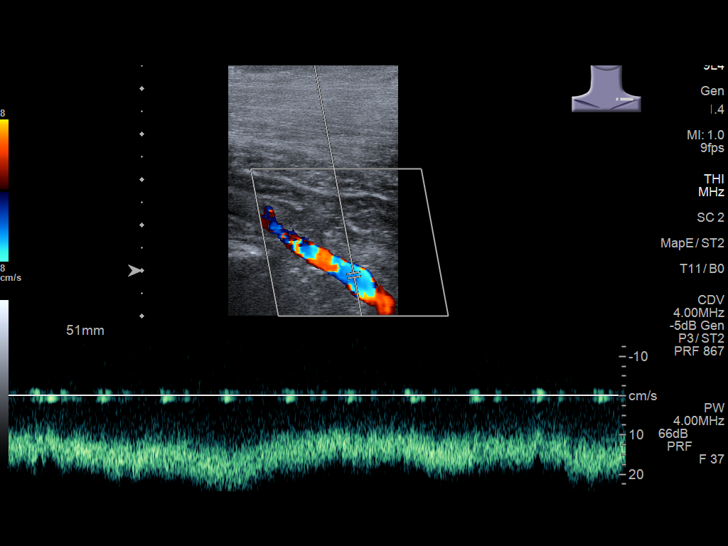

[13 of 24 positions shown; findings below may reference images not displayed]

FINDINGS: Contralateral Subclavian Vein: Respiratory phasicity is normal and
symmetric with the symptomatic side. No evidence of thrombus. Normal
compressibility.

Internal Jugular Vein: No evidence of thrombus. Normal
compressibility, respiratory phasicity and response to augmentation.

Subclavian Vein: No evidence of thrombus. Normal compressibility,
respiratory phasicity and response to augmentation.

Axillary Vein: No evidence of thrombus. Normal compressibility,
respiratory phasicity and response to augmentation.

Cephalic Vein: No evidence of thrombus. Normal compressibility,
respiratory phasicity and response to augmentation.

Basilic Vein: No evidence of thrombus. Normal compressibility,
respiratory phasicity and response to augmentation.

Brachial Veins: No evidence of thrombus. Normal compressibility,
respiratory phasicity and response to augmentation.

Radial Veins: No evidence of thrombus. Normal compressibility,
respiratory phasicity and response to augmentation.

Ulnar Veins: No evidence of thrombus. Normal compressibility,
respiratory phasicity and response to augmentation.

Other Findings:  None visualized.
IMPRESSION: Sonographic survey of the right upper extremity negative for DVT

## 2022-09-20 ENCOUNTER — Telehealth (INDEPENDENT_AMBULATORY_CARE_PROVIDER_SITE_OTHER): Payer: Medicare Other | Admitting: Psychiatry

## 2022-09-20 ENCOUNTER — Encounter (HOSPITAL_COMMUNITY): Payer: Self-pay | Admitting: Psychiatry

## 2022-09-20 DIAGNOSIS — G4733 Obstructive sleep apnea (adult) (pediatric): Secondary | ICD-10-CM

## 2022-09-20 DIAGNOSIS — G4709 Other insomnia: Secondary | ICD-10-CM

## 2022-09-20 DIAGNOSIS — F1210 Cannabis abuse, uncomplicated: Secondary | ICD-10-CM | POA: Diagnosis not present

## 2022-09-20 DIAGNOSIS — F410 Panic disorder [episodic paroxysmal anxiety] without agoraphobia: Secondary | ICD-10-CM

## 2022-09-20 DIAGNOSIS — F4310 Post-traumatic stress disorder, unspecified: Secondary | ICD-10-CM | POA: Diagnosis not present

## 2022-09-20 DIAGNOSIS — Z79899 Other long term (current) drug therapy: Secondary | ICD-10-CM

## 2022-09-20 DIAGNOSIS — F331 Major depressive disorder, recurrent, moderate: Secondary | ICD-10-CM

## 2022-09-20 DIAGNOSIS — S069X9S Unspecified intracranial injury with loss of consciousness of unspecified duration, sequela: Secondary | ICD-10-CM

## 2022-09-20 DIAGNOSIS — G894 Chronic pain syndrome: Secondary | ICD-10-CM

## 2022-09-20 DIAGNOSIS — F411 Generalized anxiety disorder: Secondary | ICD-10-CM

## 2022-09-20 DIAGNOSIS — F063 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, unspecified: Secondary | ICD-10-CM

## 2022-09-20 MED ORDER — MIRTAZAPINE 15 MG PO TABS
15.0000 mg | ORAL_TABLET | Freq: Every day | ORAL | 1 refills | Status: DC
Start: 2022-09-20 — End: 2022-11-19

## 2022-09-20 MED ORDER — PRAZOSIN HCL 2 MG PO CAPS
2.0000 mg | ORAL_CAPSULE | Freq: Every day | ORAL | 1 refills | Status: AC
Start: 2022-09-20 — End: ?

## 2022-09-20 MED ORDER — DIVALPROEX SODIUM ER 500 MG PO TB24
1000.0000 mg | ORAL_TABLET | Freq: Every day | ORAL | 2 refills | Status: AC
Start: 2022-09-20 — End: ?

## 2022-09-20 MED ORDER — BUPROPION HCL ER (XL) 450 MG PO TB24
450.0000 mg | ORAL_TABLET | Freq: Every morning | ORAL | 2 refills | Status: AC
Start: 2022-09-20 — End: ?

## 2022-09-20 MED ORDER — SERTRALINE HCL 100 MG PO TABS
200.0000 mg | ORAL_TABLET | Freq: Every day | ORAL | 2 refills | Status: AC
Start: 2022-09-20 — End: ?
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
## 2022-09-20 NOTE — Patient Instructions (Addendum)
We added Remeron 15 mg nightly to your regimen today.  Take this 30 to 45 minutes before you want to be asleep.  This will take the place of the Benadryl which could make her memory impairment worse.  Your best to try and cut back on the amount of marijuana you are using; that should help your mood as well.  If he developed a fever or unable to move please go back to the emergency department have your infection evaluated.

## 2022-10-22 ENCOUNTER — Telehealth (HOSPITAL_COMMUNITY): Payer: Medicare Other | Admitting: Psychiatry

## 2022-11-05 ENCOUNTER — Other Ambulatory Visit: Payer: Self-pay | Admitting: Orthopedic Surgery

## 2022-11-05 ENCOUNTER — Other Ambulatory Visit: Payer: Self-pay

## 2022-11-05 DIAGNOSIS — S5332XA Traumatic rupture of left ulnar collateral ligament, initial encounter: Secondary | ICD-10-CM

## 2022-11-05 DIAGNOSIS — S62109A Fracture of unspecified carpal bone, unspecified wrist, initial encounter for closed fracture: Secondary | ICD-10-CM

## 2022-11-08 ENCOUNTER — Telehealth (HOSPITAL_COMMUNITY): Payer: Medicare Other | Admitting: Psychiatry

## 2022-11-09 ENCOUNTER — Ambulatory Visit
Admission: RE | Admit: 2022-11-09 | Discharge: 2022-11-09 | Disposition: A | Discharge status: Home / Self Care | Payer: Medicare Other | Source: Ambulatory Visit | Attending: Orthopedic Surgery | Admitting: Orthopedic Surgery

## 2022-11-09 DIAGNOSIS — S5332XA Traumatic rupture of left ulnar collateral ligament, initial encounter: Secondary | ICD-10-CM | POA: Insufficient documentation

## 2022-11-09 DIAGNOSIS — S62109A Fracture of unspecified carpal bone, unspecified wrist, initial encounter for closed fracture: Secondary | ICD-10-CM | POA: Diagnosis present

## 2022-11-18 ENCOUNTER — Other Ambulatory Visit (HOSPITAL_COMMUNITY): Payer: Self-pay | Admitting: Psychiatry

## 2022-11-18 DIAGNOSIS — F331 Major depressive disorder, recurrent, moderate: Secondary | ICD-10-CM

## 2022-11-18 DIAGNOSIS — G4709 Other insomnia: Secondary | ICD-10-CM

## 2022-11-18 DIAGNOSIS — F411 Generalized anxiety disorder: Secondary | ICD-10-CM

## 2022-11-18 DIAGNOSIS — F410 Panic disorder [episodic paroxysmal anxiety] without agoraphobia: Secondary | ICD-10-CM

## 2022-12-17 ENCOUNTER — Telehealth (HOSPITAL_COMMUNITY): Payer: Self-pay

## 2022-12-17 NOTE — Telephone Encounter (Signed)
Advice - Patient left a message questioning the house or facility that Dr. Adrian Blackwater was referring him to in [place]. Patient stated he had lost the number and wanted to get the number of the facility Dr. Adrian Blackwater suggested he contact. Message sent to Dr. Adrian Blackwater to question.

## 2022-12-20 NOTE — Telephone Encounter (Signed)
Advice - Telephone call with patient to inform of [facility] address and phone number that had been shared previously with him by Dr. Adrian Blackwater. Pt agreed with plan to call them first to get their walkin hours and then will go for an evaluation.

## 2022-12-26 ENCOUNTER — Other Ambulatory Visit (HOSPITAL_COMMUNITY): Payer: Self-pay | Admitting: Psychiatry

## 2022-12-26 DIAGNOSIS — G894 Chronic pain syndrome: Secondary | ICD-10-CM

## 2022-12-26 DIAGNOSIS — S069X9S Unspecified intracranial injury with loss of consciousness of unspecified duration, sequela: Secondary | ICD-10-CM

## 2022-12-26 DIAGNOSIS — F063 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, unspecified: Secondary | ICD-10-CM

## 2023-01-10 ENCOUNTER — Encounter: Payer: Self-pay | Admitting: Occupational Therapy

## 2023-01-10 ENCOUNTER — Ambulatory Visit: Payer: Medicare Other | Attending: Surgery | Admitting: Occupational Therapy

## 2023-01-10 DIAGNOSIS — M25642 Stiffness of left hand, not elsewhere classified: Secondary | ICD-10-CM | POA: Diagnosis present

## 2023-01-10 DIAGNOSIS — M25632 Stiffness of left wrist, not elsewhere classified: Secondary | ICD-10-CM | POA: Diagnosis present

## 2023-01-10 DIAGNOSIS — M25532 Pain in left wrist: Secondary | ICD-10-CM | POA: Insufficient documentation

## 2023-01-10 DIAGNOSIS — M79642 Pain in left hand: Secondary | ICD-10-CM | POA: Diagnosis present

## 2023-01-10 DIAGNOSIS — M6281 Muscle weakness (generalized): Secondary | ICD-10-CM | POA: Diagnosis present
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
## 2023-01-15 ENCOUNTER — Encounter: Payer: Self-pay | Admitting: Occupational Therapy

## 2023-01-15 ENCOUNTER — Ambulatory Visit: Payer: Medicare Other | Admitting: Occupational Therapy

## 2023-01-15 DIAGNOSIS — M79642 Pain in left hand: Secondary | ICD-10-CM | POA: Diagnosis not present

## 2023-01-15 DIAGNOSIS — M6281 Muscle weakness (generalized): Secondary | ICD-10-CM

## 2023-01-15 DIAGNOSIS — M25632 Stiffness of left wrist, not elsewhere classified: Secondary | ICD-10-CM

## 2023-01-15 DIAGNOSIS — M25532 Pain in left wrist: Secondary | ICD-10-CM

## 2023-01-15 DIAGNOSIS — M25642 Stiffness of left hand, not elsewhere classified: Secondary | ICD-10-CM
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
## 2023-01-17 ENCOUNTER — Encounter: Payer: Self-pay | Admitting: Occupational Therapy

## 2023-01-17 ENCOUNTER — Ambulatory Visit: Payer: Medicare Other | Admitting: Occupational Therapy

## 2023-01-17 DIAGNOSIS — M6281 Muscle weakness (generalized): Secondary | ICD-10-CM

## 2023-01-17 DIAGNOSIS — M25642 Stiffness of left hand, not elsewhere classified: Secondary | ICD-10-CM

## 2023-01-17 DIAGNOSIS — M79642 Pain in left hand: Secondary | ICD-10-CM

## 2023-01-17 DIAGNOSIS — M25532 Pain in left wrist: Secondary | ICD-10-CM

## 2023-01-17 DIAGNOSIS — M25632 Stiffness of left wrist, not elsewhere classified: Secondary | ICD-10-CM
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
## 2023-01-22 ENCOUNTER — Ambulatory Visit: Payer: Medicare Other | Admitting: Occupational Therapy

## 2023-01-22 DIAGNOSIS — M6281 Muscle weakness (generalized): Secondary | ICD-10-CM

## 2023-01-22 DIAGNOSIS — M25532 Pain in left wrist: Secondary | ICD-10-CM

## 2023-01-22 DIAGNOSIS — M79642 Pain in left hand: Secondary | ICD-10-CM | POA: Diagnosis not present

## 2023-01-22 DIAGNOSIS — M25642 Stiffness of left hand, not elsewhere classified: Secondary | ICD-10-CM

## 2023-01-22 DIAGNOSIS — M25632 Stiffness of left wrist, not elsewhere classified: Secondary | ICD-10-CM
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
## 2023-01-24 ENCOUNTER — Ambulatory Visit: Payer: Medicare Other | Admitting: Occupational Therapy

## 2023-01-24 DIAGNOSIS — M6281 Muscle weakness (generalized): Secondary | ICD-10-CM

## 2023-01-24 DIAGNOSIS — M79642 Pain in left hand: Secondary | ICD-10-CM | POA: Diagnosis not present

## 2023-01-24 DIAGNOSIS — M25532 Pain in left wrist: Secondary | ICD-10-CM

## 2023-01-24 DIAGNOSIS — M25642 Stiffness of left hand, not elsewhere classified: Secondary | ICD-10-CM

## 2023-01-24 DIAGNOSIS — M25632 Stiffness of left wrist, not elsewhere classified: Secondary | ICD-10-CM
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
## 2023-01-29 ENCOUNTER — Ambulatory Visit: Payer: Medicare Other | Admitting: Occupational Therapy

## 2023-01-29 DIAGNOSIS — M25532 Pain in left wrist: Secondary | ICD-10-CM

## 2023-01-29 DIAGNOSIS — M25632 Stiffness of left wrist, not elsewhere classified: Secondary | ICD-10-CM

## 2023-01-29 DIAGNOSIS — M25642 Stiffness of left hand, not elsewhere classified: Secondary | ICD-10-CM

## 2023-01-29 DIAGNOSIS — M79642 Pain in left hand: Secondary | ICD-10-CM

## 2023-01-29 DIAGNOSIS — M6281 Muscle weakness (generalized): Secondary | ICD-10-CM

## 2023-01-31 ENCOUNTER — Ambulatory Visit: Payer: Medicare Other | Admitting: Occupational Therapy

## 2023-01-31 DIAGNOSIS — M79642 Pain in left hand: Secondary | ICD-10-CM | POA: Diagnosis not present

## 2023-01-31 DIAGNOSIS — M25532 Pain in left wrist: Secondary | ICD-10-CM

## 2023-01-31 DIAGNOSIS — M25642 Stiffness of left hand, not elsewhere classified: Secondary | ICD-10-CM

## 2023-01-31 DIAGNOSIS — M25632 Stiffness of left wrist, not elsewhere classified: Secondary | ICD-10-CM

## 2023-01-31 DIAGNOSIS — M6281 Muscle weakness (generalized): Secondary | ICD-10-CM

## 2023-02-01 ENCOUNTER — Encounter: Payer: Self-pay | Admitting: Occupational Therapy
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
## 2023-02-02 ENCOUNTER — Encounter: Payer: Self-pay | Admitting: Occupational Therapy
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
## 2023-02-07 ENCOUNTER — Ambulatory Visit: Payer: Medicare Other | Attending: Surgery | Admitting: Occupational Therapy

## 2023-02-07 DIAGNOSIS — M25632 Stiffness of left wrist, not elsewhere classified: Secondary | ICD-10-CM | POA: Diagnosis present

## 2023-02-07 DIAGNOSIS — M6281 Muscle weakness (generalized): Secondary | ICD-10-CM | POA: Diagnosis present

## 2023-02-07 DIAGNOSIS — M25642 Stiffness of left hand, not elsewhere classified: Secondary | ICD-10-CM | POA: Insufficient documentation

## 2023-02-07 DIAGNOSIS — M79642 Pain in left hand: Secondary | ICD-10-CM | POA: Insufficient documentation

## 2023-02-07 DIAGNOSIS — M25532 Pain in left wrist: Secondary | ICD-10-CM | POA: Diagnosis present
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
## 2023-02-11 ENCOUNTER — Ambulatory Visit: Payer: Medicare Other | Admitting: Occupational Therapy

## 2023-02-12 ENCOUNTER — Ambulatory Visit: Payer: Medicare Other | Admitting: Occupational Therapy

## 2023-02-12 DIAGNOSIS — M6281 Muscle weakness (generalized): Secondary | ICD-10-CM

## 2023-02-12 DIAGNOSIS — M25632 Stiffness of left wrist, not elsewhere classified: Secondary | ICD-10-CM

## 2023-02-12 DIAGNOSIS — M79642 Pain in left hand: Secondary | ICD-10-CM

## 2023-02-12 DIAGNOSIS — M25642 Stiffness of left hand, not elsewhere classified: Secondary | ICD-10-CM

## 2023-02-12 DIAGNOSIS — M25532 Pain in left wrist: Secondary | ICD-10-CM
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
## 2023-02-14 ENCOUNTER — Ambulatory Visit: Payer: Medicare Other | Admitting: Occupational Therapy

## 2023-02-14 DIAGNOSIS — M25642 Stiffness of left hand, not elsewhere classified: Secondary | ICD-10-CM

## 2023-02-14 DIAGNOSIS — M79642 Pain in left hand: Secondary | ICD-10-CM

## 2023-02-14 DIAGNOSIS — M25632 Stiffness of left wrist, not elsewhere classified: Secondary | ICD-10-CM

## 2023-02-14 DIAGNOSIS — M25532 Pain in left wrist: Secondary | ICD-10-CM

## 2023-02-14 DIAGNOSIS — M6281 Muscle weakness (generalized): Secondary | ICD-10-CM
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
## 2023-02-19 ENCOUNTER — Ambulatory Visit: Payer: Medicare Other | Admitting: Occupational Therapy

## 2023-02-19 DIAGNOSIS — M25642 Stiffness of left hand, not elsewhere classified: Secondary | ICD-10-CM

## 2023-02-19 DIAGNOSIS — M79642 Pain in left hand: Secondary | ICD-10-CM | POA: Diagnosis not present

## 2023-02-19 DIAGNOSIS — M25632 Stiffness of left wrist, not elsewhere classified: Secondary | ICD-10-CM

## 2023-02-19 DIAGNOSIS — M6281 Muscle weakness (generalized): Secondary | ICD-10-CM

## 2023-02-19 DIAGNOSIS — M25532 Pain in left wrist: Secondary | ICD-10-CM
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
## 2023-02-21 ENCOUNTER — Ambulatory Visit: Payer: Medicare Other | Admitting: Occupational Therapy

## 2023-02-21 DIAGNOSIS — M79642 Pain in left hand: Secondary | ICD-10-CM

## 2023-02-21 DIAGNOSIS — M25532 Pain in left wrist: Secondary | ICD-10-CM

## 2023-02-21 DIAGNOSIS — M25632 Stiffness of left wrist, not elsewhere classified: Secondary | ICD-10-CM

## 2023-02-21 DIAGNOSIS — M6281 Muscle weakness (generalized): Secondary | ICD-10-CM

## 2023-02-21 DIAGNOSIS — M25642 Stiffness of left hand, not elsewhere classified: Secondary | ICD-10-CM
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
## 2023-02-26 ENCOUNTER — Ambulatory Visit: Payer: Medicare Other | Admitting: Occupational Therapy

## 2023-02-26 DIAGNOSIS — M79642 Pain in left hand: Secondary | ICD-10-CM

## 2023-02-26 DIAGNOSIS — M25632 Stiffness of left wrist, not elsewhere classified: Secondary | ICD-10-CM

## 2023-02-26 DIAGNOSIS — M6281 Muscle weakness (generalized): Secondary | ICD-10-CM

## 2023-02-26 DIAGNOSIS — M25532 Pain in left wrist: Secondary | ICD-10-CM

## 2023-02-26 DIAGNOSIS — M25642 Stiffness of left hand, not elsewhere classified: Secondary | ICD-10-CM
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
## 2023-02-28 ENCOUNTER — Ambulatory Visit: Payer: Medicare Other | Admitting: Occupational Therapy

## 2023-03-04 ENCOUNTER — Ambulatory Visit: Payer: Medicare Other | Admitting: Occupational Therapy

## 2023-03-04 ENCOUNTER — Emergency Department (HOSPITAL_COMMUNITY): Payer: Medicare Other

## 2023-03-04 ENCOUNTER — Other Ambulatory Visit: Payer: Self-pay

## 2023-03-04 ENCOUNTER — Emergency Department (HOSPITAL_COMMUNITY)
Admission: EM | Admit: 2023-03-04 | Discharge: 2023-03-05 | Disposition: A | Discharge status: Home / Self Care | Payer: Medicare Other | Attending: Emergency Medicine | Admitting: Emergency Medicine

## 2023-03-04 ENCOUNTER — Encounter (HOSPITAL_COMMUNITY): Payer: Self-pay

## 2023-03-04 DIAGNOSIS — Z6831 Body mass index (BMI) 31.0-31.9, adult: Secondary | ICD-10-CM | POA: Insufficient documentation

## 2023-03-04 DIAGNOSIS — Z7982 Long term (current) use of aspirin: Secondary | ICD-10-CM | POA: Insufficient documentation

## 2023-03-04 DIAGNOSIS — N132 Hydronephrosis with renal and ureteral calculous obstruction: Secondary | ICD-10-CM | POA: Insufficient documentation

## 2023-03-04 DIAGNOSIS — Z9149 Other personal history of psychological trauma, not elsewhere classified: Secondary | ICD-10-CM | POA: Insufficient documentation

## 2023-03-04 DIAGNOSIS — R109 Unspecified abdominal pain: Secondary | ICD-10-CM | POA: Diagnosis not present

## 2023-03-04 DIAGNOSIS — F32A Depression, unspecified: Secondary | ICD-10-CM | POA: Insufficient documentation

## 2023-03-04 DIAGNOSIS — I2510 Atherosclerotic heart disease of native coronary artery without angina pectoris: Secondary | ICD-10-CM | POA: Insufficient documentation

## 2023-03-04 DIAGNOSIS — G4700 Insomnia, unspecified: Secondary | ICD-10-CM | POA: Diagnosis not present

## 2023-03-04 DIAGNOSIS — E785 Hyperlipidemia, unspecified: Secondary | ICD-10-CM | POA: Diagnosis not present

## 2023-03-04 DIAGNOSIS — Z87820 Personal history of traumatic brain injury: Secondary | ICD-10-CM | POA: Insufficient documentation

## 2023-03-04 DIAGNOSIS — J449 Chronic obstructive pulmonary disease, unspecified: Secondary | ICD-10-CM | POA: Insufficient documentation

## 2023-03-04 DIAGNOSIS — I700 Atherosclerosis of aorta: Secondary | ICD-10-CM | POA: Diagnosis not present

## 2023-03-04 DIAGNOSIS — F1290 Cannabis use, unspecified, uncomplicated: Secondary | ICD-10-CM | POA: Insufficient documentation

## 2023-03-04 DIAGNOSIS — G8929 Other chronic pain: Secondary | ICD-10-CM | POA: Insufficient documentation

## 2023-03-04 DIAGNOSIS — Z87442 Personal history of urinary calculi: Secondary | ICD-10-CM | POA: Insufficient documentation

## 2023-03-04 DIAGNOSIS — R45851 Suicidal ideations: Secondary | ICD-10-CM | POA: Diagnosis not present

## 2023-03-04 LAB — COMPREHENSIVE METABOLIC PANEL
ALT: 21 U/L (ref 0–44)
AST: 27 U/L (ref 15–41)
Albumin: 3.7 g/dL (ref 3.5–5.0)
Alkaline Phosphatase: 73 U/L (ref 38–126)
Anion gap: 8 (ref 5–15)
BUN: 12 mg/dL (ref 6–20)
CO2: 28 mmol/L (ref 22–32)
Calcium: 9.2 mg/dL (ref 8.9–10.3)
Chloride: 105 mmol/L (ref 98–111)
Creatinine, Ser: 1.01 mg/dL (ref 0.61–1.24)
GFR, Estimated: >60 mL/min (ref >60)
Glucose, Bld: 89 mg/dL (ref 70–99)
Potassium: 4.5 mmol/L (ref 3.5–5.1)
Sodium: 141 mmol/L (ref 135–145)
Total Bilirubin: 0.9 mg/dL (ref 0.3–1.2)
Total Protein: 7.1 g/dL (ref 6.5–8.1)

## 2023-03-04 LAB — ACETAMINOPHEN LEVEL: Acetaminophen (Tylenol), Serum: <10 ug/mL — ABNORMAL LOW (ref 10–30)

## 2023-03-04 LAB — CBC
HCT: 46.1 % (ref 39.0–52.0)
Hemoglobin: 14.9 g/dL (ref 13.0–17.0)
MCH: 29.1 pg (ref 26.0–34.0)
MCHC: 32.3 g/dL (ref 30.0–36.0)
MCV: 90 fL (ref 80.0–100.0)
Platelets: 229 10*3/uL (ref 150–400)
RBC: 5.12 MIL/uL (ref 4.22–5.81)
RDW: 14.6 % (ref 11.5–15.5)
WBC: 9.6 10*3/uL (ref 4.0–10.5)
nRBC: 0 % (ref 0.0–0.2)

## 2023-03-04 LAB — RAPID URINE DRUG SCREEN, HOSP PERFORMED
Amphetamines: NOT DETECTED
Barbiturates: POSITIVE — AB
Benzodiazepines: NOT DETECTED
Cocaine: NOT DETECTED
Opiates: NOT DETECTED
Tetrahydrocannabinol: POSITIVE — AB

## 2023-03-04 LAB — URINALYSIS, ROUTINE W REFLEX MICROSCOPIC
Bilirubin Urine: NEGATIVE
Glucose, UA: NEGATIVE mg/dL
Ketones, ur: 5 mg/dL — AB
Nitrite: NEGATIVE
Protein, ur: NEGATIVE mg/dL
Specific Gravity, Urine: 1.018 (ref 1.005–1.030)
pH: 5 (ref 5.0–8.0)

## 2023-03-04 LAB — ETHANOL: Alcohol, Ethyl (B): <10 mg/dL (ref <10)

## 2023-03-04 LAB — SALICYLATE LEVEL: Salicylate Lvl: <7 mg/dL — ABNORMAL LOW (ref 7.0–30.0)

## 2023-03-04 MED ORDER — KETOROLAC TROMETHAMINE 30 MG/ML IJ SOLN
60.0000 mg | Freq: Once | INTRAMUSCULAR | Status: AC
  Administered 2023-03-04: 60 mg via INTRAMUSCULAR
  Filled 2023-03-04: qty 2

## 2023-03-04 MED ORDER — HYDROCODONE-ACETAMINOPHEN 5-325 MG PO TABS
1.0000 | ORAL_TABLET | Freq: Once | ORAL | Status: AC
  Administered 2023-03-04: 1 via ORAL
  Filled 2023-03-04: qty 1

## 2023-03-04 MED ORDER — ADULT MULTIVITAMIN W/MINERALS CH
1.0000 | ORAL_TABLET | Freq: Every day | ORAL | Status: DC
  Administered 2023-03-04: 1 via ORAL
  Filled 2023-03-04: qty 1

## 2023-03-04 MED ORDER — OXYCODONE-ACETAMINOPHEN 5-325 MG PO TABS
1.0000 | ORAL_TABLET | Freq: Four times a day (QID) | ORAL | Status: DC | PRN
  Administered 2023-03-05: 1 via ORAL
  Filled 2023-03-04: qty 1

## 2023-03-04 MED ORDER — THIAMINE MONONITRATE 100 MG PO TABS
100.0000 mg | ORAL_TABLET | Freq: Every day | ORAL | Status: DC
  Administered 2023-03-04: 100 mg via ORAL
  Filled 2023-03-04: qty 1

## 2023-03-04 MED ORDER — THIAMINE HCL 100 MG/ML IJ SOLN: 100.0000 mg | Freq: Every day | INTRAMUSCULAR | Status: DC

## 2023-03-04 MED ORDER — LORAZEPAM 1 MG PO TABS: 1.0000 mg | ORAL_TABLET | ORAL | Status: DC | PRN

## 2023-03-04 MED ORDER — LORAZEPAM 2 MG/ML IJ SOLN: 1.0000 mg | INTRAMUSCULAR | Status: DC | PRN

## 2023-03-04 MED ORDER — TAMSULOSIN HCL 0.4 MG PO CAPS
0.4000 mg | ORAL_CAPSULE | Freq: Every day | ORAL | Status: DC
  Administered 2023-03-04: 0.4 mg via ORAL
  Filled 2023-03-04: qty 1

## 2023-03-04 MED ORDER — FOLIC ACID 1 MG PO TABS
1.0000 mg | ORAL_TABLET | Freq: Every day | ORAL | Status: DC
  Administered 2023-03-04: 1 mg via ORAL
  Filled 2023-03-04: qty 1

## 2023-03-04 NOTE — ED Notes (Signed)
Patient to CT with safety sitter

## 2023-03-04 NOTE — ED Notes (Signed)
Pt belongings in locker 5 

## 2023-03-04 NOTE — ED Provider Notes (Signed)
[place] EMERGENCY DEPARTMENT AT [facility] Provider Note   CSN: [id number] Arrival date & time: 03/04/23  1457     History {Add pertinent medical, surgical, social history, OB history to HPI:1} Chief Complaint  Patient presents with   Suicidal    Jeffery Hubbard is a 47 y.o. male.  He is here with a complaint of increased depressive symptoms and suicidal thoughts.  He said it is the anniversary of his wedding and he has been separated and he has had more depression.  He started drinking and he does not want to take his psych meds when he is drinking so he has been off his medicines for a few days.  He also saw his primary care doctor for some urinary symptoms and some blood in his urine and was put on Levaquin and was told not to take his Seroquel when he is taking the Levaquin.  He went to health clinic today who referred him here for further evaluation and mental health eval.  He denies any concerns for going into alcohol withdrawal.  He also endorses marijuana use.  He does say his urinary symptoms and hematuria are improved and he still taking the Levaquin.  The history is provided by the patient.  Mental Health Problem Presenting symptoms: depression and suicidal thoughts   Onset quality:  Gradual Duration:  1 week Timing:  Constant Progression:  Unchanged Chronicity:  Recurrent Context: alcohol use and stressful life event   Worsened by:  Alcohol Associated symptoms: no abdominal pain and no chest pain   Risk factors: hx of mental illness        Home Medications Prior to Admission medications   Medication Sig Start Date End Date Taking? Authorizing Provider  aspirin EC 81 MG EC tablet Take 1 tablet (81 mg total) by mouth daily. Swallow whole. 09/06/21   Clapacs, Jackquline Denmark, MD  atorvastatin (LIPITOR) 80 MG tablet Take 1 tablet (80 mg total) by mouth daily. 09/05/21   Clapacs, Jackquline Denmark, MD  baclofen (LIORESAL) 10 MG tablet Take 1 tablet (10 mg total) by mouth daily.  09/05/21   Clapacs, Jackquline Denmark, MD  buPROPion HCl ER, XL, 450 MG TB24 Take 1 tablet (450 mg total) by mouth every morning. 09/20/22   Elsie Lincoln, MD  cholecalciferol (VITAMIN D) 25 MCG (1000 UNIT) tablet Take 2 tablets (2,000 Units total) by mouth daily. 09/06/21   Clapacs, Jackquline Denmark, MD  divalproex (DEPAKOTE ER) 500 MG 24 hr tablet Take 2 tablets (1,000 mg total) by mouth at bedtime. 09/20/22   Elsie Lincoln, MD  metoprolol succinate (TOPROL-XL) 25 MG 24 hr tablet Take 1 tablet (25 mg total) by mouth at bedtime. 09/05/21   Clapacs, Jackquline Denmark, MD  mirtazapine (REMERON) 15 MG tablet TAKE 1 TABLET BY MOUTH AT BEDTIME 11/19/22   Elsie Lincoln, MD  mometasone-formoterol (DULERA) 100-5 MCG/ACT AERO Inhale 2 puffs into the lungs 2 (two) times daily. 09/05/21   Clapacs, Jackquline Denmark, MD  naloxone [facility]) nasal spray 4 mg/0.1 mL Place 1 spray into the nose as needed (For overdose of narcotics). 09/05/21   Clapacs, Jackquline Denmark, MD  nitroGLYCERIN (NITROSTAT) 0.4 MG SL tablet Place 0.4 mg under the tongue every 5 (five) minutes as needed for chest pain.    [provider]  pantoprazole (PROTONIX) 40 MG tablet Take 1 tablet (40 mg total) by mouth daily before breakfast. 09/05/21   Clapacs, Jackquline Denmark, MD  prazosin (MINIPRESS) 2 MG capsule Take  1 capsule (2 mg total) by mouth at bedtime. 09/20/22   Elsie Lincoln, MD  pregabalin (LYRICA) 200 MG capsule Take 1 capsule (200 mg total) by mouth 3 (three) times daily. 09/05/21   Clapacs, Jackquline Denmark, MD  primidone (MYSOLINE) 50 MG tablet Take 50 mg by mouth 2 (two) times daily.    [provider]  sertraline (ZOLOFT) 100 MG tablet Take 2 tablets (200 mg total) by mouth daily. Keep on file 09/20/22   Elsie Lincoln, MD  VENTOLIN HFA 108 [phone] Base) MCG/ACT inhaler Inhale 2 puffs into the lungs every 6 (six) hours as needed. For shortness of breath/wheezing. 09/05/21   Clapacs, Jackquline Denmark, MD      Allergies    Opana [oxymorphone] and Topamax [topiramate]    Review of Systems    Review of Systems  Cardiovascular:  Negative for chest pain.  Gastrointestinal:  Negative for abdominal pain.  Genitourinary:  Positive for flank pain and hematuria.  Psychiatric/Behavioral:  Positive for suicidal ideas.     Physical Exam Updated Vital Signs BP (!) 138/91   Pulse 76   Temp 98.2 F (36.8 C) (Oral)   Resp 16   Ht 5\' 8"  (1.727 m)   Wt 93 kg   SpO2 99%   BMI 31.17 kg/m  Physical Exam Vitals and nursing note reviewed.  Constitutional:      General: He is not in acute distress.    Appearance: Normal appearance. He is well-developed.  HENT:     Head: Normocephalic and atraumatic.  Eyes:     Conjunctiva/sclera: Conjunctivae normal.  Cardiovascular:     Rate and Rhythm: Normal rate and regular rhythm.     Heart sounds: No murmur heard. Pulmonary:     Effort: Pulmonary effort is normal. No respiratory distress.     Breath sounds: Normal breath sounds.  Abdominal:     Palpations: Abdomen is soft.     Tenderness: There is no abdominal tenderness. There is no guarding or rebound.  Musculoskeletal:        General: No deformity.     Cervical back: Neck supple.  Skin:    General: Skin is warm and dry.     Capillary Refill: Capillary refill takes less than 2 seconds.  Neurological:     General: No focal deficit present.     Mental Status: He is alert.     ED Results / Procedures / Treatments   Labs (all labs ordered are listed, but only abnormal results are displayed) Labs Reviewed  RAPID URINE DRUG SCREEN, HOSP PERFORMED - Abnormal; Notable for the following components:      Result Value   Tetrahydrocannabinol POSITIVE (*)    Barbiturates POSITIVE (*)    All other components within normal limits  SALICYLATE LEVEL - Abnormal; Notable for the following components:   Salicylate Lvl <7.0 (*)    All other components within normal limits  ACETAMINOPHEN LEVEL - Abnormal; Notable for the following components:   Acetaminophen (Tylenol), Serum <10 (*)    All  other components within normal limits  URINALYSIS, ROUTINE W REFLEX MICROSCOPIC - Abnormal; Notable for the following components:   APPearance HAZY (*)    Hgb urine dipstick SMALL (*)    Ketones, ur 5 (*)    Leukocytes,Ua TRACE (*)    Bacteria, UA RARE (*)    All other components within normal limits  COMPREHENSIVE METABOLIC PANEL  ETHANOL  CBC    EKG None  Radiology No results found.  Procedures Procedures  {Document cardiac monitor, telemetry assessment procedure when appropriate:1}  Medications Ordered in ED Medications  LORazepam (ATIVAN) tablet 1-4 mg (has no administration in time range)    Or  LORazepam (ATIVAN) injection 1-4 mg (has no administration in time range)  thiamine (VITAMIN B1) tablet 100 mg (has no administration in time range)    Or  thiamine (VITAMIN B1) injection 100 mg (has no administration in time range)  folic acid (FOLVITE) tablet 1 mg (has no administration in time range)  multivitamin with minerals tablet 1 tablet (has no administration in time range)    ED Course/ Medical Decision Making/ A&P   {   Click here for ABCD2, HEART and other calculatorsREFRESH Note before signing :1}                              Medical Decision Making Amount and/or Complexity of Data Reviewed Labs: ordered.   This patient complains of ***; this involves an extensive number of treatment Options and is a complaint that carries with it a high risk of complications and morbidity. The differential includes ***  I ordered, reviewed and interpreted labs, which included *** I ordered medication *** and reviewed PMP when indicated. I ordered imaging studies which included *** and I independently    visualized and interpreted imaging which showed *** Additional history obtained from *** Previous records obtained and reviewed *** I consulted *** and discussed lab and imaging findings and discussed disposition.  Cardiac monitoring reviewed, *** Social determinants  considered, *** Critical Interventions: ***  After the interventions stated above, I reevaluated the patient and found *** Admission and further testing considered, ***   {Document critical care time when appropriate:1} {Document review of labs and clinical decision tools ie heart score, Chads2Vasc2 etc:1}  {Document your independent review of radiology images, and any outside records:1} {Document your discussion with family members, caretakers, and with consultants:1} {Document social determinants of health affecting pt's care:1} {Document your decision making why or why not admission, treatments were needed:1} Final Clinical Impression(s) / ED Diagnoses Final diagnoses:  None    Rx / DC Orders ED Discharge Orders     None

## 2023-03-04 NOTE — ED Triage Notes (Addendum)
Pt was at [facility] health services crisis ctr and was sent here to get back on psych meds. Pt states last took MH meds 4 days ago. Pt was drinking over the weekend and didn't want to mix psych meds with the ETOH. Pt endorses SI, but does not have a plan. Pt's last ETOH 03/03/23 at 2100.

## 2023-03-04 NOTE — ED Provider Triage Note (Signed)
Emergency Medicine Provider Triage Evaluation Note  Jeffery Hubbard , a 47 y.o. male  was evaluated in triage.  Pt complains of suicidal ideation.  Patient states he has been with suicidal ideation over the past 2 days or so.  Reports anniversary of his prior marriage which she has been divorced for the past 2 years.  Patient reportedly being off his at home Zoloft for the past several weeks.  Denies any homicidal ideation, auditory/visual hallucinations.  Patient does report 65-month history of 12-16 beers per night.  Reports last drink last night and had about 12 pack of beer.  Also reporting some right-sided flank pain.  Went to his primary care on Thursday with hematuria and was placed on antibiotics.  States that for the past couple of days developed right-sided flank pain.  Patient with history of kidney stone but states this feels "a little different."  Review of Systems  Positive: Breath Negative:   Physical Exam  BP (!) 138/91   Pulse 76   Temp 98.2 F (36.8 C) (Oral)   Resp 16   Ht 5\' 8"  (1.727 m)   Wt 93 kg   SpO2 99%   BMI 31.17 kg/m  Gen:   Awake, no distress   Resp:  Normal effort  MSK:   Moves extremities without difficulty  Other:  Right CVA tenderness.  Medical Decision Making  Medically screening exam initiated at 4:53 PM.  Appropriate orders placed.  OLLIVER BOYADJIAN was informed that the remainder of the evaluation will be completed by another provider, this initial triage assessment does not replace that evaluation, and the importance of remaining in the ED until their evaluation is complete.     Peter Garter, [place] 03/04/23 [phone]

## 2023-03-04 NOTE — Discharge Instructions (Addendum)
You are seen in the emergency department for depression and right sided flank pain.  You had a 2 mm kidney stone that is likely the cause of your pain.  You will need to drink plenty of fluids and we are giving you pain medication and tamsulosin to help the stone pass.  Please schedule a follow-up appointment with urology.  Return to the emergency department for any fevers or uncontrolled pain.  You also need to be evaluated by mental health regarding your depression.  For pain control you may take 1000 mg of acetaminophen (Tylenol) every 8 hours and/or 600 mg of Ibuprofen (Motrin, Advil, etc.) every 6-8 hours as needed.  Please limit acetaminophen (Tylenol) to 4000 mg and Ibuprofen (Motrin, Advil, etc.) to 2400 mg for a 24hr period. Please note that other over-the-counter medicine may contain acetaminophen or ibuprofen as a component of their ingredients.

## 2023-03-05 DIAGNOSIS — F32A Depression, unspecified: Secondary | ICD-10-CM

## 2023-03-05 MED ORDER — TAMSULOSIN HCL 0.4 MG PO CAPS: 0.4000 mg | ORAL_CAPSULE | Freq: Every day | ORAL | 0 refills | Status: AC

## 2023-03-05 NOTE — BH Assessment (Signed)
TTS consult has been deferred to [facility]. The [facility] coordinator will update in established secure chat with assessment time and name of provider.

## 2023-03-05 NOTE — ED Provider Notes (Signed)
I assumed care of this patient from previous provider.  Please see their note for further details of history, exam, and MDM.   Briefly patient is a 47 y.o. male who presented increased depression and suicidal ideation.  Currently pending behavioral health evaluation and disposition.  Patient also noted to have a 2 mm ureteral stone.  Currently pain controlled.  UA without evidence of infection.  Dr. Gilman Schmidt from telemetry psychiatry called after evaluating patient and cleared patient for continued outpatient management.  Patient is aware of the plan and agreeable.   The patient appears reasonably screened and/or stabilized for discharge and I doubt any other medical condition or other [facility] requiring further screening, evaluation, or treatment in the ED at this time. I have discussed the findings, Dx and Tx plan with the patient/family who expressed understanding and agree(s) with the plan. Discharge instructions discussed at length. The patient/family was given strict return precautions who verbalized understanding of the instructions. No further questions at time of discharge.  Disposition: Discharge  Condition: Good  ED Discharge Orders          Ordered    tamsulosin (FLOMAX) 0.4 MG CAPS capsule  Daily        03/05/23 0743             Follow Up: [facility] [address] [place] [address] [phone] Schedule an appointment as soon as possible for a visit  For recheck of your symptoms  Marina Goodell, MD [address] [place] [address] [phone]  Call  as needed        Nira Conn, MD 03/05/23 [phone]

## 2023-03-05 NOTE — ED Notes (Signed)
Pt items returned to patient. He verified he has everything he came with.

## 2023-03-05 NOTE — Consult Note (Signed)
[facility] Consult Note  Patient Name: Jeffery Hubbard MRN: [id number] DOB: 1976/01/14 DATE OF Consult: 03/05/2023  PRIMARY PSYCHIATRIC DIAGNOSES  1.  Depression, unspecified  2.  TBI per history 3.  PTSD per history   RECOMMENDATIONS  Recommendations: Medication recommendations: Restart home meds and follow-up with psychiatrist Non-Medication/therapeutic recommendations: -Follow-up with outpatient therapist and psychiatrist; crisis line information; ED return precautions Is inpatient psychiatric hospitalization recommended for this patient? No (Explain why): Passive suicidal ideation, denies suicidal intent and plan. Came to the ED to talk to people, wants to go home and restart his meds and follow-up outpatient Follow-Up Telepsychiatry C/L services: We will sign off for now. Please re-consult our service if needed for any concerning changes in the patient's condition, discharge planning, or questions. Communication: Treatment team members (and family members if applicable) who were involved in treatment/care discussions and planning, and with whom we spoke or engaged with via secure text/chat, include the following: Dr. Eudelia Bunch  Thank you for involving [place] in the care of this patient. If you have any additional questions or concerns, please call [phone] and ask for me or the provider on-call.  TELEPSYCHIATRY ATTESTATION & CONSENT  As the provider for this telehealth consult, I attest that I verified the patient's identity using two separate identifiers, introduced myself to the patient, provided my credentials, disclosed my location, and performed this encounter via a HIPAA-compliant, real-time, face-to-face, two-way, interactive audio and video platform and with the full consent and agreement of the patient (or guardian as applicable.)  Patient physical location: ED in [facility]  Telehealth provider physical location: home office in state of [place].  Video start time:  0558 AM EST Video end time: 0617 AM EST  IDENTIFYING DATA  Jeffery Hubbard is a 47 y.o. year-old male for whom a psychiatric consultation has been ordered by the primary provider. The patient was identified using two separate identifiers.  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 UDS positive for barbiturates and THC.  On evaluation, patient noted to be calm, cooperative, dysthymic, constricted, linear, not appearing internally preoccupied, not responding to internal stimuli, alert and oriented x 4. Patient reports he came to the ED because he has been in pain and stopped taking his medication 2-3 days ago because he felt overwhelmed and his primary doctor told him not to start his new psych med due to being on an antibiotic. Does not know the name of the new medication he was prescribed. Per chart review, medication is Seroquel. Patient reports he came to the ED "to note be alone." States Saturday was his 29th wedding anniversary, reports he has been separated for a year. Reports his oldest son is staying with him right now, states it is good to have company, reports his son also struggles with depression. Patient endorses depressed mood, states he has been depressed since he was 47 years old. Endorses longstanding insomnia, not wearing BiPAP. Patient reports he enjoys fishing. Patient endorses passive suicidal ideation, denies suicidal intent and plan. Patient denies access to firearms. Denies symptoms consistent with mania/hypomania, paranoia, auditory and visual hallucinations, homicidal ideation. Patient reports he has a pill box and generally takes his medication as prescribed. Patient reports he  called his peer  support worker to have someone to talk to and to get back on his medication. Reports he has all of his medication at home.    PAST PSYCHIATRIC HISTORY  Current psych meds: Reports he stopped taking his medication 49 days ago   Prior psych meds: Depakote, Remeron, Zoloft, Wellbutrin Outpatient: Has a therapist; has a psychiatrist; has peer support  Inpatient: 2-3 times Non-suicidal self injury: Denies Suicide attempts:  Denies Violence: Denies Trauma/neglect/abuse: Denies Drugs/alcohol: Cannabis use Otherwise as per HPI above.  PAST MEDICAL HISTORY  Past Medical History:  Diagnosis Date   Anxiety    Arthritis    left shoulder, right elbow   COPD (chronic obstructive pulmonary disease) (HCC)    Coronary artery disease    Depression    GERD (gastroesophageal reflux disease)    Headache    history of migraine   History of kidney stones 2002   MDD (major depressive disorder) 08/30/2021   Moderate major depression (HCC) 01/16/2006   Myocardial infarction (HCC) 04/2013   OSA on CPAP 01/27/2020   PONV (postoperative nausea and vomiting) 2003   with back surgery   PTSD (post-traumatic stress disorder) 2011   Right leg weakness    S/P back injury   Sleep apnea    uses bipap   Wears dentures    partial upper     HOME MEDICATIONS  Facility Ordered Medications  Medication   LORazepam (ATIVAN) tablet 1-4 mg   Or   LORazepam (ATIVAN) injection 1-4 mg   thiamine (VITAMIN B1) tablet 100 mg   Or   thiamine (VITAMIN B1) injection 100 mg   folic acid (FOLVITE) tablet 1 mg   multivitamin with minerals tablet 1 tablet   [COMPLETED] HYDROcodone-acetaminophen (NORCO/VICODIN) 5-325 MG per tablet 1 tablet   [COMPLETED] ketorolac (TORADOL) 30 MG/ML injection 60 mg   tamsulosin (FLOMAX) capsule 0.4 mg   oxyCODONE-acetaminophen (PERCOCET/ROXICET) 5-325 MG per tablet 1 tablet   PTA Medications  Medication Sig   nitroGLYCERIN (NITROSTAT) 0.4 MG SL tablet Place 0.4 mg under the tongue every  5 (five) minutes as needed for chest pain.   aspirin EC 81 MG EC tablet Take 1 tablet (81 mg total) by mouth daily. Swallow whole.   atorvastatin (LIPITOR) 80 MG tablet Take 1 tablet (80 mg total) by mouth daily.   pantoprazole (PROTONIX) 40 MG tablet Take 1 tablet (40 mg total) by mouth daily before breakfast.   baclofen (LIORESAL) 10 MG tablet Take 1 tablet (10 mg total) by mouth daily.   pregabalin (LYRICA) 200 MG capsule Take 1 capsule (200 mg total) by mouth 3 (three) times daily.   mometasone-formoterol (DULERA) 100-5 MCG/ACT AERO Inhale 2 puffs into the lungs 2 (two) times daily.   VENTOLIN HFA 108 (90 Base) MCG/ACT inhaler Inhale 2 puffs into the lungs every 6 (six) hours as needed. For shortness of breath/wheezing.   primidone (MYSOLINE) 50 MG tablet Take 50 mg by mouth 2 (two) times daily.   buPROPion HCl ER, XL, 450 MG TB24 Take 1 tablet (450 mg total) by mouth every morning.   divalproex (DEPAKOTE ER) 500 MG 24 hr tablet Take 2 tablets (1,000 mg total) by mouth at bedtime.   sertraline (ZOLOFT) 100 MG tablet Take 2 tablets (200 mg total) by mouth daily. Keep on file   prazosin (MINIPRESS) 2 MG capsule Take 1 capsule (2 mg total) by mouth at bedtime.   mirtazapine (REMERON) 15 MG tablet TAKE 1 TABLET BY MOUTH AT BEDTIME  amLODipine (NORVASC) 5 MG tablet Take 5 mg by mouth daily.   levofloxacin (LEVAQUIN) 500 MG tablet Take 500 mg by mouth daily.   naloxone (NARCAN) nasal spray 4 mg/0.1 mL Place 1 spray into the nose as needed (For overdose of narcotics). (Patient not taking: Reported on 03/04/2023)   QUEtiapine (SEROQUEL) 50 MG tablet Take 50 mg by mouth as directed. (Patient not taking: Reported on 03/04/2023)     ALLERGIES  Allergies  Allergen Reactions   Opana [Oxymorphone] Itching   Topamax [Topiramate] Other (See Comments)    Severe headaches.    SOCIAL & SUBSTANCE USE HISTORY  Social History   Socioeconomic History   Marital status: Married    Spouse name: Not on file    Number of children: Not on file   Years of education: Not on file   Highest education level: Not on file  Occupational History   Not on file  Tobacco Use   Smoking status: Former    Current packs/day: 0.00    Types: Cigarettes    Quit date: 04/2013    Years since quitting: 9.9   Smokeless tobacco: Never  Vaping Use   Vaping status: Never Used  Substance and Sexual Activity   Alcohol use: Yes    Alcohol/week: 20.0 standard drinks of alcohol    Types: 20 Cans of beer per week   Drug use: Yes    Types: Marijuana    Comment: daily   Sexual activity: Not on file  Other Topics Concern   Not on file  Social History Narrative   Not on file   Social Determinants of Health   Financial Resource Strain: Not on file  Food Insecurity: No Food Insecurity (04/06/2019)   [facility] from [facility]   Hunger Vital Sign    Worried About Running Out of Food in the Last Year: Never true    Ran Out of Food in the Last Year: Never true  Transportation Needs: Not on file  Physical Activity: Not on file  Stress: Not on file  Social Connections: Not on file   Social History   Tobacco Use  Smoking Status Former   Current packs/day: 0.00   Types: Cigarettes   Quit date: 04/2013   Years since quitting: 9.9  Smokeless Tobacco Never   Social History   Substance and Sexual Activity  Alcohol Use Yes   Alcohol/week: 20.0 standard drinks of alcohol   Types: 20 Cans of beer per week   Social History   Substance and Sexual Activity  Drug Use Yes   Types: Marijuana   Comment: daily     FAMILY HISTORY   Family Psychiatric History (if known):  Son: Depression   MENTAL STATUS EXAM (MSE)  Presentation  General Appearance:  Appropriate for Environment  Eye Contact: Good  Speech: Normal Rate  Speech Volume: Normal   Mood and Affect  Mood: Dysthymic  Affect: Constricted   Thought Process  Thought Processes: Coherent   Orientation: Full (Time,  Place and Person)  Thought Content: Abstract Reasoning  History of Schizophrenia/Schizoaffective disorder: Denies  Hallucinations: Hallucinations: None  Ideas of Reference: None  Suicidal Thoughts: Endorses passive suicidal ideation, denies suicidal intent and plan  Homicidal Thoughts: Homicidal Thoughts: No   Sensorium  Memory: Immediate Good; Recent Good; Remote Good  Judgment: Fair  Insight: Fair   Executive Functions  Concentration: Good   Psychomotor Activity  Psychomotor Activity: Psychomotor Activity: Normal   Assets  Assets: Communication Skills  VITALS  Blood pressure 126/83, pulse 78, temperature 98.1 F (36.7 C), resp. rate 16, height 5\' 8"  (1.727 m), weight 93 kg, SpO2 92%.  LABS  Admission on 03/04/2023  Component Date Value Ref Range Status   Sodium 03/04/2023 141  135 - 145 mmol/L Final   Potassium 03/04/2023 4.5  3.5 - 5.1 mmol/L Final   HEMOLYSIS AT THIS LEVEL MAY AFFECT RESULT   Chloride 03/04/2023 105  98 - 111 mmol/L Final   CO2 03/04/2023 28  22 - 32 mmol/L Final   Glucose, Bld 03/04/2023 89  70 - 99 mg/dL Final   Glucose reference range applies only to samples taken after fasting for at least 8 hours.   BUN 03/04/2023 12  6 - 20 mg/dL Final   Creatinine, Ser 03/04/2023 1.01  0.61 - 1.24 mg/dL Final   Calcium 16/03/9603 9.2  8.9 - 10.3 mg/dL Final   Total Protein 54/02/8118 7.1  6.5 - 8.1 g/dL Final   Albumin 14/78/2956 3.7  3.5 - 5.0 g/dL Final   AST 21/30/8657 27  15 - 41 U/L Final   HEMOLYSIS AT THIS LEVEL MAY AFFECT RESULT   ALT 03/04/2023 21  0 - 44 U/L Final   HEMOLYSIS AT THIS LEVEL MAY AFFECT RESULT   Alkaline Phosphatase 03/04/2023 73  38 - 126 U/L Final   Total Bilirubin 03/04/2023 0.9  0.3 - 1.2 mg/dL Final   HEMOLYSIS AT THIS LEVEL MAY AFFECT RESULT   GFR, Estimated 03/04/2023 >60  >60 mL/min Final   Comment: (NOTE) Calculated using the CKD-EPI Creatinine Equation (2021)    Anion gap 03/04/2023 8  5 - 15  Final   Performed at [facility], [address] [place], [place] [address]   Alcohol, Ethyl (B) 03/04/2023 <10  <10 mg/dL Final   Comment: (NOTE) Lowest detectable limit for serum alcohol is 10 mg/dL.  For medical purposes only. Performed at [facility], [address], [place], [place] [address]    WBC 03/04/2023 9.6  4.0 - 10.5 K/uL Final   RBC 03/04/2023 5.12  4.22 - 5.81 MIL/uL Final   Hemoglobin 03/04/2023 14.9  13.0 - 17.0 g/dL Final   HCT 41/32/4401 46.1  39.0 - 52.0 % Final   MCV 03/04/2023 90.0  80.0 - 100.0 fL Final   MCH 03/04/2023 29.1  26.0 - 34.0 pg Final   MCHC 03/04/2023 32.3  30.0 - 36.0 g/dL Final   RDW 02/72/5366 14.6  11.5 - 15.5 % Final   Platelets 03/04/2023 229  150 - 400 K/uL Final   nRBC 03/04/2023 0.0  0.0 - 0.2 % Final   Performed at [facility], [address], [place], [place] [address]   Opiates 03/04/2023 NONE DETECTED  NONE DETECTED Final   Cocaine 03/04/2023 NONE DETECTED  NONE DETECTED Final   Benzodiazepines 03/04/2023 NONE DETECTED  NONE DETECTED Final   Amphetamines 03/04/2023 NONE DETECTED  NONE DETECTED Final   Tetrahydrocannabinol 03/04/2023 POSITIVE (A)  NONE DETECTED Final   Barbiturates 03/04/2023 POSITIVE (A)  NONE DETECTED Final   Comment: (NOTE) DRUG SCREEN FOR MEDICAL PURPOSES ONLY.  IF CONFIRMATION IS NEEDED FOR ANY PURPOSE, NOTIFY LAB WITHIN 5 DAYS.  LOWEST DETECTABLE LIMITS FOR URINE DRUG SCREEN Drug Class                     Cutoff (ng/mL) Amphetamine and metabolites    1000 Barbiturate and metabolites    200 Benzodiazepine  200 Opiates and metabolites        300 Cocaine and metabolites        300 THC                            50 Performed at [facility], [address], [place], [place] [address]    Salicylate Lvl 03/04/2023 <7.0 (L)  7.0 - 30.0 mg/dL Final   Performed at [facility], [address] [place], [place] [address]   Acetaminophen (Tylenol), Serum  03/04/2023 <10 (L)  10 - 30 ug/mL Final   Comment: (NOTE) Therapeutic concentrations vary significantly. A range of 10-30 ug/mL  may be an effective concentration for many patients. However, some  are best treated at concentrations outside of this range. Acetaminophen concentrations >150 ug/mL at 4 hours after ingestion  and >50 ug/mL at 12 hours after ingestion are often associated with  toxic reactions.  Performed at [facility], [address], [place], [place] [address]    Color, Urine 03/04/2023 YELLOW  YELLOW Final   APPearance 03/04/2023 HAZY (A)  CLEAR Final   Specific Gravity, Urine 03/04/2023 1.018  1.005 - 1.030 Final   pH 03/04/2023 5.0  5.0 - 8.0 Final   Glucose, UA 03/04/2023 NEGATIVE  NEGATIVE mg/dL Final   Hgb urine dipstick 03/04/2023 SMALL (A)  NEGATIVE Final   Bilirubin Urine 03/04/2023 NEGATIVE  NEGATIVE Final   Ketones, ur 03/04/2023 5 (A)  NEGATIVE mg/dL Final   Protein, ur 47/82/9562 NEGATIVE  NEGATIVE mg/dL Final   Nitrite 13/01/6577 NEGATIVE  NEGATIVE Final   Leukocytes,Ua 03/04/2023 TRACE (A)  NEGATIVE Final   RBC / HPF 03/04/2023 0-5  0 - 5 RBC/hpf Final   WBC, UA 03/04/2023 6-10  0 - 5 WBC/hpf Final   Bacteria, UA 03/04/2023 RARE (A)  NONE SEEN Final   Squamous Epithelial / HPF 03/04/2023 0-5  0 - 5 /HPF Final   Mucus 03/04/2023 PRESENT   Final   Performed at [facility], [address], [place], [place] [address]    PSYCHIATRIC REVIEW OF SYSTEMS (ROS)  ROS: Notable for the following relevant positive findings: ROS  Additional findings:      Musculoskeletal: No abnormal movements observed      Gait & Station: Laying/Sitting      Pain Screening: Present - mild to moderate      Nutrition & Dental Concerns: n/a  RISK FORMULATION/ASSESSMENT  Is the patient experiencing any suicidal or homicidal ideations: Yes       Explain if yes: Endorses passive suicidal ideation, denies suicidal intent and plan Protective factors considered for safety  management: Family, social support, future oriented, identifies reasons to live  Risk factors/concerns considered for safety management:  Depression Physical illness/chronic pain Male gender  Is there a safety management plan with the patient and treatment team to minimize risk factors and promote protective factors: Yes           Explain: -Follow up with outpatient therapist and psychiatrist; - Crisis line information; -ED return precautions  Is crisis care placement or psychiatric hospitalization recommended: No     Based on my current evaluation and risk assessment, patient is determined at this time to be at:  Low risk  *RISK ASSESSMENT Risk assessment is a dynamic process; it is possible that this patient's condition, and risk level, may change. This should be re-evaluated and managed over time as appropriate.  Please re-consult psychiatric consult services if additional assistance is needed in terms of risk assessment and management. If your team decides to discharge this patient, please advise the patient how to best access emergency psychiatric services, or to call 911, if their condition worsens or they feel unsafe in any way.  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 UDS positive for barbiturates and THC. On evaluation, patient noted to be calm, cooperative, dysthymic, constricted, linear, not appearing internally preoccupied, not responding to internal stimuli, alert and oriented x 4. Patient reports he came to the ED because he has been in pain and stopped taking his medication 2-3 days ago because he felt overwhelmed. States Saturday was his 29th wedding anniversary, reports he has been separated for a year. Reports his oldest son is staying with him right now, states it is good to have company,  reports his son also struggles with depression. Patient endorses depressed mood, states he has been depressed since he was 47 years old. Endorses longstanding insomnia, not wearing BiPAP. Patient endorses passive suicidal ideation, denies suicidal intent and plan. Patient denies access to firearms. Reports he has all of his medication at home and plans to resume them. The patient's presentation is consistent with depression, unspecified, TBI and PTSD per history. The patient endorses passive suicidal ideation, denies suicidal intent and plan. The patient is currently low risk for suicide due to protective factors including family, social support, future oriented, identifies reasons to live. Therefore, inpatient psychiatric hospitalization is not recommended.    Adria Dill, MD Telepsychiatry Consult Services

## 2023-03-10 ENCOUNTER — Other Ambulatory Visit: Payer: Self-pay

## 2023-03-10 ENCOUNTER — Emergency Department
Admission: EM | Admit: 2023-03-10 | Discharge: 2023-03-10 | Disposition: A | Discharge status: Home / Self Care | Payer: Medicare Other | Attending: Emergency Medicine | Admitting: Emergency Medicine

## 2023-03-10 DIAGNOSIS — Z7982 Long term (current) use of aspirin: Secondary | ICD-10-CM | POA: Diagnosis not present

## 2023-03-10 DIAGNOSIS — N200 Calculus of kidney: Secondary | ICD-10-CM | POA: Diagnosis not present

## 2023-03-10 DIAGNOSIS — J449 Chronic obstructive pulmonary disease, unspecified: Secondary | ICD-10-CM | POA: Insufficient documentation

## 2023-03-10 DIAGNOSIS — Z79899 Other long term (current) drug therapy: Secondary | ICD-10-CM | POA: Diagnosis not present

## 2023-03-10 DIAGNOSIS — I2510 Atherosclerotic heart disease of native coronary artery without angina pectoris: Secondary | ICD-10-CM | POA: Insufficient documentation

## 2023-03-10 DIAGNOSIS — R109 Unspecified abdominal pain: Secondary | ICD-10-CM | POA: Diagnosis present

## 2023-03-10 LAB — CBC
HCT: 41.4 % (ref 39.0–52.0)
Hemoglobin: 13.5 g/dL (ref 13.0–17.0)
MCH: 29.4 pg (ref 26.0–34.0)
MCHC: 32.6 g/dL (ref 30.0–36.0)
MCV: 90.2 fL (ref 80.0–100.0)
Platelets: 196 10*3/uL (ref 150–400)
RBC: 4.59 MIL/uL (ref 4.22–5.81)
RDW: 14.6 % (ref 11.5–15.5)
WBC: 6.1 10*3/uL (ref 4.0–10.5)
nRBC: 0 % (ref 0.0–0.2)

## 2023-03-10 LAB — URINALYSIS, ROUTINE W REFLEX MICROSCOPIC
Bilirubin Urine: NEGATIVE
Glucose, UA: NEGATIVE mg/dL
Hgb urine dipstick: NEGATIVE
Ketones, ur: NEGATIVE mg/dL
Nitrite: NEGATIVE
Protein, ur: NEGATIVE mg/dL
Specific Gravity, Urine: 1.026 (ref 1.005–1.030)
Squamous Epithelial / HPF: 0 /[HPF] (ref 0–5)
pH: 5 (ref 5.0–8.0)

## 2023-03-10 LAB — BASIC METABOLIC PANEL
Anion gap: 9 (ref 5–15)
BUN: 14 mg/dL (ref 6–20)
CO2: 26 mmol/L (ref 22–32)
Calcium: 8.9 mg/dL (ref 8.9–10.3)
Chloride: 106 mmol/L (ref 98–111)
Creatinine, Ser: 1.22 mg/dL (ref 0.61–1.24)
GFR, Estimated: >60 mL/min (ref >60)
Glucose, Bld: 155 mg/dL — ABNORMAL HIGH (ref 70–99)
Potassium: 3.5 mmol/L (ref 3.5–5.1)
Sodium: 141 mmol/L (ref 135–145)

## 2023-03-10 MED ORDER — IBUPROFEN 800 MG PO TABS: 800.0000 mg | ORAL_TABLET | Freq: Three times a day (TID) | ORAL | 0 refills | Status: DC | PRN

## 2023-03-10 MED ORDER — KETOROLAC TROMETHAMINE 30 MG/ML IJ SOLN
30.0000 mg | Freq: Once | INTRAMUSCULAR | Status: AC
  Administered 2023-03-10: 30 mg via INTRAVENOUS
  Filled 2023-03-10: qty 1

## 2023-03-10 MED ORDER — ONDANSETRON 4 MG PO TBDP: 4.0000 mg | ORAL_TABLET | Freq: Four times a day (QID) | ORAL | 0 refills | Status: DC | PRN

## 2023-03-10 MED ORDER — HYDROCODONE-ACETAMINOPHEN 5-325 MG PO TABS: 2.0000 | ORAL_TABLET | Freq: Four times a day (QID) | ORAL | 0 refills | Status: DC | PRN

## 2023-03-10 MED ORDER — FENTANYL CITRATE PF 50 MCG/ML IJ SOSY
50.0000 ug | PREFILLED_SYRINGE | INTRAMUSCULAR | Status: DC | PRN
  Administered 2023-03-10: 50 ug via INTRAVENOUS
  Filled 2023-03-10: qty 1

## 2023-03-10 NOTE — Discharge Instructions (Addendum)
Please continue your Flomax as prescribed.  Please follow-up with urology as needed.  You are being provided a prescription for opiates (also known as narcotics) for pain control.  Opiates can be addictive and should only be used when absolutely necessary for pain control when other alternatives do not work.  We recommend you only use them for the recommended amount of time and only as prescribed.  Please do not take with other sedative medications or alcohol.  Please do not drive, operate machinery, make important decisions while taking opiates.  Please note that these medications can be addictive and have high abuse potential.  Patients can become addicted to narcotics after only taking them for a few days.  Please keep these medications locked away from children, teenagers or any family members with history of substance abuse.  Narcotic pain medicine may also make you constipated.  You may use over-the-counter medications such as MiraLAX, Colace to prevent constipation.  If you become constipated, you may use over-the-counter enemas as needed.  Itching and nausea are also common side effects of narcotic pain medication.  If you develop uncontrolled vomiting or a rash, please stop these medications and seek medical care.
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
## 2023-03-10 NOTE — ED Triage Notes (Signed)
Pt presents via POV c/o right sided flank pain. Reports recently dx with right sided renal calculus. Currently on flomax for same.

## 2023-03-11 LAB — URINE CULTURE: Culture: NO GROWTH

## 2023-03-20 ENCOUNTER — Encounter: Payer: Self-pay | Admitting: Urology

## 2023-03-20 ENCOUNTER — Ambulatory Visit (INDEPENDENT_AMBULATORY_CARE_PROVIDER_SITE_OTHER): Payer: Medicare Other | Admitting: Urology

## 2023-03-20 VITALS — BP 148/92 | HR 108 | Ht 68.0 in | Wt 199.0 lb

## 2023-03-20 DIAGNOSIS — N200 Calculus of kidney: Secondary | ICD-10-CM | POA: Diagnosis not present

## 2023-03-20 MED ORDER — TAMSULOSIN HCL 0.4 MG PO CAPS: 0.4000 mg | ORAL_CAPSULE | Freq: Every day | ORAL | 0 refills | Status: DC

## 2023-03-20 MED ORDER — KETOROLAC TROMETHAMINE 10 MG PO TABS: 10.0000 mg | ORAL_TABLET | Freq: Four times a day (QID) | ORAL | 0 refills | Status: DC | PRN
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
## 2023-03-20 NOTE — Patient Instructions (Signed)
Kidney Stones Kidney stones are rock-like masses that form inside of the kidneys. Kidneys are organs that make pee (urine). A kidney stone may move into other parts of the urinary tract, including: The tubes that connect the kidneys to the bladder (ureters). The bladder. The tube that carries urine out of the body (urethra). Kidney stones can cause very bad pain and can block the flow of pee. The stone usually leaves your body through your pee. A doctor may need to take out the stone. What are the causes? Kidney stones may be caused by: Too much calcium in the body. This may be caused by too much parathyroid hormone in the blood. Uric acid crystals in the bladder. The body makes uric acid when you eat certain foods. Narrowing of one or both of the ureters. A kidney blockage that you were born with. Past surgery on the kidney or the ureters. What increases the risk? You are more likely to develop this condition if: You have had a kidney stone in the past. Other people in your family have had kidney stones. You do not drink enough water. You eat a diet that is high in protein, salt (sodium), or sugar. You are very overweight (obese). What are the signs or symptoms? Symptoms of a kidney stone may include: Pain in the side of the belly, right below the ribs. Pain usually spreads to the groin. Needing to pee often or right away. Pain when peeing. Blood in your pee. Feeling like you may vomit (nauseous). Vomiting. Fever and chills. How is this treated? Treatment depends on the size, location, and makeup of the kidney stones. The stones will often pass out of the body when you pee. You may need to: Drink more fluid to help pass the stone. In some cases, you may be given fluids through an IV tube at the hospital. Take medicine for pain. Change your diet to help keep kidney stones from coming back. Sometimes, you may need: A procedure to break up kidney stones using a beam of light (laser)  or shock waves. Surgery to remove the kidney stones. Follow these instructions at home: Medicines Take over-the-counter and prescription medicines only as told by your doctor. Ask your doctor if the medicine prescribed to you requires you to avoid driving or using machinery. Eating and drinking Drink enough fluid to keep your pee pale yellow. You may be told to drink at least 8-10 glasses of water each day. This will help you pass the stone. If told by your doctor, change your diet. You may be told to: Limit how much salt you eat. Eat more fruits and vegetables. Limit how much meat, poultry, fish, and eggs you eat. Follow instructions from your doctor about what you may eat and drink. General instructions Collect pee samples as told by your doctor. You may need to collect a pee sample: 24 hours after a stone comes out. 8-12 weeks after a stone comes out, and every 6-12 months after that. Strain your pee every time you pee. Use the strainer that your doctor recommends. Do not throw out the stone. Keep it so that it can be tested by your doctor. Keep all follow-up visits. You may need X-rays and ultrasounds to make sure the stone has come out. How is this prevented? To prevent another kidney stone: Drink enough fluid to keep your pee pale yellow. This is the best way to prevent kidney stones. Eat healthy foods. Avoid certain foods as told by your doctor. You  may be told to eat less protein. Stay at a healthy weight. Where to find more information National Kidney Foundation (NKF): kidney.org [facility]): urologyhealth.org Contact a doctor if: You have pain that gets worse or does not get better with medicine. Get help right away if: You have a fever or chills. You get very bad pain. You get new pain in your belly. You faint. You cannot pee. This information is not intended to replace advice given to you by your health care provider. Make sure you discuss any  questions you have with your health care provider. Document Revised: 01/12/2022 Document Reviewed: 01/12/2022 Elsevier Patient Education  2024 [employer].

## 2023-04-10 ENCOUNTER — Ambulatory Visit: Payer: Medicare Other | Admitting: Urology

## 2023-04-10 ENCOUNTER — Other Ambulatory Visit: Payer: Self-pay | Admitting: *Deleted

## 2023-04-11 ENCOUNTER — Ambulatory Visit
Admission: RE | Admit: 2023-04-11 | Discharge: 2023-04-11 | Disposition: A | Discharge status: Home / Self Care | Payer: Medicare Other | Source: Ambulatory Visit | Attending: Urology | Admitting: Urology

## 2023-04-11 ENCOUNTER — Ambulatory Visit: Payer: Medicare Other | Admitting: Urology

## 2023-04-11 ENCOUNTER — Ambulatory Visit
Admission: RE | Admit: 2023-04-11 | Discharge: 2023-04-11 | Disposition: A | Discharge status: Home / Self Care | Payer: Medicare Other | Attending: Urology | Admitting: Urology

## 2023-04-11 ENCOUNTER — Encounter: Payer: Self-pay | Admitting: Urology

## 2023-04-11 VITALS — BP 137/89 | HR 81 | Ht 68.0 in | Wt 199.0 lb

## 2023-04-11 DIAGNOSIS — N200 Calculus of kidney: Secondary | ICD-10-CM | POA: Diagnosis present

## 2023-04-11 DIAGNOSIS — N201 Calculus of ureter: Secondary | ICD-10-CM

## 2023-04-11 LAB — URINALYSIS, COMPLETE
Bilirubin, UA: NEGATIVE
Glucose, UA: NEGATIVE
Ketones, UA: NEGATIVE
Leukocytes,UA: NEGATIVE
Nitrite, UA: NEGATIVE
Protein,UA: NEGATIVE
Specific Gravity, UA: >1.03 — ABNORMAL HIGH (ref 1.005–1.030)
Urobilinogen, Ur: 0.2 mg/dL (ref 0.2–1.0)
pH, UA: 6 (ref 5.0–7.5)

## 2023-04-11 LAB — MICROSCOPIC EXAMINATION

## 2023-04-11 NOTE — Progress Notes (Signed)
   04/11/2023 1:03 PM   Bethann Berkshire Ida Rogue [birth year] [id number]  Reason for visit: Follow up right ureteral stone  HPI: 47 year old male with history of depression, suicidal etiology, chronic pain on narcotics who presented to the ER on 03/04/2023 with right-sided flank pain.  CT showed a 2 mm right proximal ureteral stone with hydronephrosis, urinalysis was noninfected, and he was discharged with medical expulsive therapy.  When I saw him in October 2024 he opted to continue medical expulsive therapy.  Today, he reports any problems since our last visit.  He is not had any right-sided flank or groin pain.  Denies any gross hematuria or dysuria.  I personally viewed and interpreted the KUB today that shows no definitive evidence of stone.  We reviewed the limitations of KUB with such a small stone, and return precautions were discussed.  Urinalysis today is pending will call with results.  We discussed general stone prevention strategies including adequate hydration with goal of producing 2.5 L of urine daily, increasing citric acid intake, increasing calcium intake during high oxalate meals, minimizing animal protein, and decreasing salt intake. Information about dietary recommendations given today.   Follow-up urinalysis, follow-up with urology as needed if benign  Sondra Come, MD  [facility] [address] [place], [place] [address] [phone]

## 2023-04-16 ENCOUNTER — Encounter: Payer: Self-pay | Admitting: *Deleted

## 2023-05-06 ENCOUNTER — Ambulatory Visit: Payer: Medicare Other | Admitting: Occupational Therapy

## 2023-05-07 ENCOUNTER — Other Ambulatory Visit: Payer: Self-pay

## 2023-05-07 ENCOUNTER — Encounter
Admission: RE | Disposition: A | Discharge status: Home / Self Care | Payer: Self-pay | Source: Home / Self Care | Attending: Gastroenterology

## 2023-05-07 ENCOUNTER — Ambulatory Visit: Payer: Medicare Other | Admitting: Anesthesiology

## 2023-05-07 ENCOUNTER — Encounter: Payer: Self-pay | Admitting: *Deleted

## 2023-05-07 ENCOUNTER — Ambulatory Visit
Admission: RE | Admit: 2023-05-07 | Discharge: 2023-05-07 | Disposition: A | Discharge status: Home / Self Care | Payer: Medicare Other | Attending: Gastroenterology | Admitting: Gastroenterology

## 2023-05-07 DIAGNOSIS — R634 Abnormal weight loss: Secondary | ICD-10-CM | POA: Insufficient documentation

## 2023-05-07 DIAGNOSIS — Z87891 Personal history of nicotine dependence: Secondary | ICD-10-CM | POA: Diagnosis not present

## 2023-05-07 DIAGNOSIS — J449 Chronic obstructive pulmonary disease, unspecified: Secondary | ICD-10-CM | POA: Insufficient documentation

## 2023-05-07 DIAGNOSIS — Z955 Presence of coronary angioplasty implant and graft: Secondary | ICD-10-CM | POA: Insufficient documentation

## 2023-05-07 DIAGNOSIS — F419 Anxiety disorder, unspecified: Secondary | ICD-10-CM | POA: Insufficient documentation

## 2023-05-07 DIAGNOSIS — K219 Gastro-esophageal reflux disease without esophagitis: Secondary | ICD-10-CM | POA: Diagnosis not present

## 2023-05-07 DIAGNOSIS — R194 Change in bowel habit: Secondary | ICD-10-CM | POA: Insufficient documentation

## 2023-05-07 DIAGNOSIS — Z6829 Body mass index (BMI) 29.0-29.9, adult: Secondary | ICD-10-CM | POA: Insufficient documentation

## 2023-05-07 DIAGNOSIS — F32A Depression, unspecified: Secondary | ICD-10-CM | POA: Diagnosis not present

## 2023-05-07 DIAGNOSIS — M1990 Unspecified osteoarthritis, unspecified site: Secondary | ICD-10-CM | POA: Insufficient documentation

## 2023-05-07 DIAGNOSIS — G8929 Other chronic pain: Secondary | ICD-10-CM | POA: Insufficient documentation

## 2023-05-07 DIAGNOSIS — I2510 Atherosclerotic heart disease of native coronary artery without angina pectoris: Secondary | ICD-10-CM | POA: Diagnosis not present

## 2023-05-07 DIAGNOSIS — G4733 Obstructive sleep apnea (adult) (pediatric): Secondary | ICD-10-CM | POA: Diagnosis not present

## 2023-05-07 DIAGNOSIS — F4310 Post-traumatic stress disorder, unspecified: Secondary | ICD-10-CM | POA: Insufficient documentation

## 2023-05-07 DIAGNOSIS — R197 Diarrhea, unspecified: Secondary | ICD-10-CM | POA: Diagnosis not present

## 2023-05-07 HISTORY — DX: Other chronic pain: G89.29

## 2023-05-07 HISTORY — PX: COLONOSCOPY WITH PROPOFOL: SHX5780

## 2023-05-07 HISTORY — DX: Sprain of metacarpophalangeal joint of left thumb, initial encounter: S63.642A

## 2023-05-07 HISTORY — DX: Other enthesopathies, not elsewhere classified: M77.8

## 2023-05-07 HISTORY — DX: Presence of other specified functional implants: Z96.89

## 2023-05-07 HISTORY — DX: Pure hypercholesterolemia, unspecified: E78.00

## 2023-05-07 HISTORY — DX: Candidal esophagitis: B37.81

## 2023-05-07 HISTORY — DX: Atherosclerotic heart disease of native coronary artery without angina pectoris: I25.10

## 2023-05-07 SURGERY — COLONOSCOPY WITH PROPOFOL
Anesthesia: General

## 2023-05-07 MED ORDER — MIDAZOLAM HCL 2 MG/2ML IJ SOLN
INTRAMUSCULAR | Status: AC
  Filled 2023-05-07: qty 2

## 2023-05-07 MED ORDER — LIDOCAINE HCL (CARDIAC) PF 100 MG/5ML IV SOSY
PREFILLED_SYRINGE | INTRAVENOUS | Status: DC | PRN
  Administered 2023-05-07: 100 mg via INTRAVENOUS

## 2023-05-07 MED ORDER — SODIUM CHLORIDE 0.9 % IV SOLN: INTRAVENOUS | Status: DC

## 2023-05-07 MED ORDER — PROPOFOL 500 MG/50ML IV EMUL
INTRAVENOUS | Status: DC | PRN
  Administered 2023-05-07: 165 ug/kg/min via INTRAVENOUS

## 2023-05-07 MED ORDER — PROPOFOL 10 MG/ML IV BOLUS
INTRAVENOUS | Status: DC | PRN
  Administered 2023-05-07: 30 mg via INTRAVENOUS
  Administered 2023-05-07: 60 mg via INTRAVENOUS
  Administered 2023-05-07: 40 mg via INTRAVENOUS

## 2023-05-07 MED ORDER — MIDAZOLAM HCL 2 MG/2ML IJ SOLN
INTRAMUSCULAR | Status: DC | PRN
  Administered 2023-05-07: 2 mg via INTRAVENOUS

## 2023-05-07 NOTE — Op Note (Signed)
[facility]enterology Patient Name: Jeffery Hubbard Procedure Date: 05/07/2023 8:47 AM MRN: [id number] Account #: [id number] Date of Birth: Sep 25, 1975 Admit Type: Outpatient Age: 47 Room: [facility] ENDO ROOM 1 Gender: Male Note Status: Finalized Instrument Name: Prentice Docker [id number] Procedure:             Colonoscopy Indications:           Change in bowel habits, Diarrhea Providers:             Eather Colas MD, MD Medicines:             Monitored Anesthesia Care Complications:         No immediate complications. Procedure:             Pre-Anesthesia Assessment:                        - Prior to the procedure, a History and Physical was                         performed, and patient medications and allergies were                         reviewed. The patient is competent. The risks and                         benefits of the procedure and the sedation options and                         risks were discussed with the patient. All questions                         were answered and informed consent was obtained.                         Patient identification and proposed procedure were                         verified by the physician, the nurse, the                         anesthesiologist, the anesthetist and the technician                         in the endoscopy suite. Mental Status Examination:                         alert and oriented. Airway Examination: normal                         oropharyngeal airway and neck mobility. Respiratory                         Examination: clear to auscultation. CV Examination:                         normal. Prophylactic Antibiotics: The patient does not                         require prophylactic antibiotics. Prior  Anticoagulants: The patient has taken no anticoagulant                         or antiplatelet agents. ASA Grade Assessment: III - A                         patient with severe systemic  disease. After reviewing                         the risks and benefits, the patient was deemed in                         satisfactory condition to undergo the procedure. The                         anesthesia plan was to use monitored anesthesia care                         (MAC). Immediately prior to administration of                         medications, the patient was re-assessed for adequacy                         to receive sedatives. The heart rate, respiratory                         rate, oxygen saturations, blood pressure, adequacy of                         pulmonary ventilation, and response to care were                         monitored throughout the procedure. The physical                         status of the patient was re-assessed after the                         procedure.                        After obtaining informed consent, the colonoscope was                         passed under direct vision. Throughout the procedure,                         the patient's blood pressure, pulse, and oxygen                         saturations were monitored continuously. The                         Colonoscope was introduced through the anus and                         advanced to the the ileocecal valve. The colonoscopy  was somewhat difficult due to poor bowel prep. The                         patient tolerated the procedure well. The quality of                         the bowel preparation was poor. The ileocecal valve                         was photographed. Findings:      The perianal and digital rectal examinations were normal.      A large amount of stool was found in the entire colon, precluding       visualization. Impression:            - Preparation of the colon was poor.                        - Stool in the entire examined colon.                        - No specimens collected. Recommendation:        - Discharge patient to home.                         - Resume previous diet.                        - Continue present medications.                        - Repeat colonoscopy in 1 year because the bowel                         preparation was suboptimal.                        - Return to referring physician as previously                         scheduled. Procedure Code(s):     --- Professional ---                        [phone], Colonoscopy, flexible; diagnostic, including                         collection of specimen(s) by brushing or washing, when                         performed (separate procedure) Diagnosis Code(s):     --- Professional ---                        R19.4, Change in bowel habit                        R19.7, Diarrhea, unspecified CPT copyright 2022 [employer]. All rights reserved. The codes documented in this report are preliminary and upon coder review may  be revised to meet current compliance requirements. Eather Colas MD, MD 05/07/2023 9:25:40 AM Number of Addenda: 0 Note Initiated On: 05/07/2023 8:47  AM Scope Withdrawal Time: 0 hours 1 minute 50 seconds  Total Procedure Duration: 0 hours 5 minutes 17 seconds  Estimated Blood Loss:  Estimated blood loss: none.      [facility]

## 2023-05-07 NOTE — Anesthesia Preprocedure Evaluation (Addendum)
Anesthesia Evaluation  Patient identified by MRN, date of birth, ID band Patient awake    Reviewed: Allergy & Precautions, NPO status , Patient's Chart, lab work & pertinent test results  History of Anesthesia Complications (+) PONV and history of anesthetic complications  Airway Mallampati: III   Neck ROM: Full    Dental  (+) Missing   Pulmonary sleep apnea and Continuous Positive Airway Pressure Ventilation , COPD, former smoker (quit 2014)   Pulmonary exam normal breath sounds clear to auscultation       Cardiovascular + CAD (s/p stent)  Normal cardiovascular exam Rhythm:Regular Rate:Normal     Neuro/Psych  Headaches PSYCHIATRIC DISORDERS (PTSD) Anxiety Depression    Chronic pain    GI/Hepatic ,GERD  ,,  Endo/Other  negative endocrine ROS    Renal/GU Renal disease (nephrolithiasis)     Musculoskeletal  (+) Arthritis ,    Abdominal   Peds  Hematology negative hematology ROS (+)   Anesthesia Other Findings Cardiology note 02/07/23:  #CAD s/p LAD PCI (2014) Stent was placed for chest pain with positive stress test, not ACS.  Patient recently separated from his wife, and with this he has started smoking again and stopped taking all of his medications. -He is agreeable to resume aspirin and atorvastatin 80mg  daily -No need for beta blocker given preserved EF, will not resume metoprolol -Counseled on smoking cessation  #HTN BP mildly elevated in office today.  -Will start amlodipine 5mg  daily -Counseled to begin measuring BP at home. He has a lot going on right now while he goes through separating from his wife, this will not be a priority for him.  #HLD -Resume atorva 80mg  daily -Check lipids at next visit  We will plan to have the patient return for follow up in 6 months.    Reproductive/Obstetrics                             Anesthesia Physical Anesthesia Plan  ASA:  3  Anesthesia Plan: General   Post-op Pain Management:    Induction: Intravenous  PONV Risk Score and Plan: 2 and Propofol infusion, TIVA and Treatment may vary due to age or medical condition  Airway Management Planned: Natural Airway  Additional Equipment:   Intra-op Plan:   Post-operative Plan:   Informed Consent: I have reviewed the patients History and Physical, chart, labs and discussed the procedure including the risks, benefits and alternatives for the proposed anesthesia with the patient or authorized representative who has indicated his/her understanding and acceptance.       Plan Discussed with: CRNA  Anesthesia Plan Comments: (LMA/GETA backup discussed.  Patient consented for risks of anesthesia including but not limited to:  - adverse reactions to medications - damage to eyes, teeth, lips or other oral mucosa - nerve damage due to positioning  - sore throat or hoarseness - damage to heart, brain, nerves, lungs, other parts of body or loss of life  Informed patient about role of CRNA in peri- and intra-operative care.  Patient voiced understanding.)        Anesthesia Quick Evaluation

## 2023-05-07 NOTE — H&P (Signed)
Outpatient short stay form Pre-procedure 05/07/2023  Regis Bill, MD  Primary Physician: Marina Goodell, MD  Reason for visit:  Diarrhea/Weight loss/Change in bowel habits  History of present illness:    47 y/o gentleman with history of anxiety, depression, chronic pain, and OSA here for colonoscopy due to history of change in bowel habits/weight loss/loose stools. Never had colonoscopy before. No blood thinners. No significant abdominal surgeries. No family history of GI malignancies.    Current Facility-Administered Medications:    0.9 %  sodium chloride infusion, , Intravenous, Continuous, Ramie Palladino, Rossie Muskrat, MD  Medications Prior to Admission  Medication Sig Dispense Refill Last Dose   metoprolol succinate (TOPROL-XL) 25 MG 24 hr tablet Take 25 mg by mouth daily.      naproxen (NAPROSYN) 500 MG tablet Take 500 mg by mouth 2 (two) times daily with a meal.      amLODipine (NORVASC) 5 MG tablet Take 5 mg by mouth daily.      aspirin EC 81 MG EC tablet Take 1 tablet (81 mg total) by mouth daily. Swallow whole. 30 tablet 0    atorvastatin (LIPITOR) 80 MG tablet Take 1 tablet (80 mg total) by mouth daily. 30 tablet 0    baclofen (LIORESAL) 10 MG tablet Take 1 tablet (10 mg total) by mouth daily. 30 each 0    buPROPion HCl ER, XL, 450 MG TB24 Take 1 tablet (450 mg total) by mouth every morning. 30 tablet 2    divalproex (DEPAKOTE ER) 500 MG 24 hr tablet Take 2 tablets (1,000 mg total) by mouth at bedtime. 60 tablet 2    HYDROcodone-acetaminophen (NORCO/VICODIN) 5-325 MG tablet Take 2 tablets by mouth every 6 (six) hours as needed. 16 tablet 0    ibuprofen (ADVIL) 800 MG tablet Take 1 tablet (800 mg total) by mouth every 8 (eight) hours as needed. 30 tablet 0    ketorolac (TORADOL) 10 MG tablet Take 1 tablet (10 mg total) by mouth every 6 (six) hours as needed for severe pain (pain score 7-10). 12 tablet 0    mirtazapine (REMERON) 15 MG tablet TAKE 1 TABLET BY MOUTH AT BEDTIME 30  tablet 0    mometasone-formoterol (DULERA) 100-5 MCG/ACT AERO Inhale 2 puffs into the lungs 2 (two) times daily. 1 each 0    naloxone (NARCAN) nasal spray 4 mg/0.1 mL Place 1 spray into the nose as needed (For overdose of narcotics). 1 each 0    nitroGLYCERIN (NITROSTAT) 0.4 MG SL tablet Place 0.4 mg under the tongue every 5 (five) minutes as needed for chest pain.      ondansetron (ZOFRAN-ODT) 4 MG disintegrating tablet Take 1 tablet (4 mg total) by mouth every 6 (six) hours as needed for nausea or vomiting. 20 tablet 0    pantoprazole (PROTONIX) 40 MG tablet Take 1 tablet (40 mg total) by mouth daily before breakfast. 30 tablet 0    prazosin (MINIPRESS) 2 MG capsule Take 1 capsule (2 mg total) by mouth at bedtime. 90 capsule 1    pregabalin (LYRICA) 200 MG capsule Take 1 capsule (200 mg total) by mouth 3 (three) times daily. 90 capsule 0    primidone (MYSOLINE) 50 MG tablet Take 50 mg by mouth 2 (two) times daily.      QUEtiapine (SEROQUEL) 50 MG tablet Take 50 mg by mouth as directed.      sertraline (ZOLOFT) 100 MG tablet Take 2 tablets (200 mg total) by mouth daily. Keep on file 60  tablet 2    tamsulosin (FLOMAX) 0.4 MG CAPS capsule Take 1 capsule (0.4 mg total) by mouth daily. 30 capsule 0    VENTOLIN HFA 108 (90 Base) MCG/ACT inhaler Inhale 2 puffs into the lungs every 6 (six) hours as needed. For shortness of breath/wheezing. 1 each 0      Allergies  Allergen Reactions   Opana [Oxymorphone] Itching   Topamax [Topiramate] Other (See Comments)    Severe headaches.     Past Medical History:  Diagnosis Date   Anxiety    Arthritis    left shoulder, right elbow   Candidiasis of esophagus (HCC)    Chronic pain    COPD (chronic obstructive pulmonary disease) (HCC)    Coronary artery disease    Coronary atherosclerosis    Depression    GERD (gastroesophageal reflux disease)    Headache    history of migraine   History of kidney stones 2002   Hypercholesterolemia    Kidney stone     MDD (major depressive disorder) 08/30/2021   Moderate major depression (HCC) 01/16/2006   Myocardial infarction (HCC) 04/2013   OSA on CPAP 01/27/2020   PONV (postoperative nausea and vomiting) 2003   with back surgery   PTSD (post-traumatic stress disorder) 2011   Right leg weakness    S/P back injury   Rupture of ulnar collateral ligament of left thumb    Sleep apnea    uses bipap   Spinal cord stimulator status    Tendinitis of left wrist    Wears dentures    partial upper    Review of systems:  Otherwise negative.    Physical Exam  Gen: Alert, oriented. Appears stated age.  HEENT: PERRLA. Lungs: No respiratory distress CV: RRR Abd: soft, benign, no masses Ext: No edema    Planned procedures: Proceed with colonoscopy. The patient understands the nature of the planned procedure, indications, risks, alternatives and potential complications including but not limited to bleeding, infection, perforation, damage to internal organs and possible oversedation/side effects from anesthesia. The patient agrees and gives consent to proceed.  Please refer to procedure notes for findings, recommendations and patient disposition/instructions.     Regis Bill, MD [facility]enter[facility]

## 2023-05-07 NOTE — Anesthesia Postprocedure Evaluation (Signed)
Anesthesia Post Note  Patient: Jeffery Hubbard  Procedure(s) Performed: COLONOSCOPY WITH PROPOFOL  Patient location during evaluation: PACU Anesthesia Type: General Level of consciousness: awake and alert, oriented and patient cooperative Pain management: pain level controlled Vital Signs Assessment: post-procedure vital signs reviewed and stable Respiratory status: spontaneous breathing, nonlabored ventilation and respiratory function stable Cardiovascular status: blood pressure returned to baseline and stable Postop Assessment: adequate PO intake Anesthetic complications: no   No notable events documented.   Last Vitals:  Vitals:   05/07/23 0924 05/07/23 0934  BP: 106/77 112/71  Pulse:  91  Resp:  19  Temp: (!) 36.1 C   SpO2:  99%    Last Pain:  Vitals:   05/07/23 0944  TempSrc:   PainSc: 5                  Reed Breech

## 2023-05-07 NOTE — Transfer of Care (Signed)
Immediate Anesthesia Transfer of Care Note  Patient: Jeffery Hubbard  Procedure(s) Performed: COLONOSCOPY WITH PROPOFOL  Patient Location: Endoscopy Unit  Anesthesia Type:General  Level of Consciousness: drowsy and patient cooperative  Airway & Oxygen Therapy: Patient Spontanous Breathing and Patient connected to nasal cannula oxygen  Post-op Assessment: Report given to RN and Post -op Vital signs reviewed and stable  Post vital signs: Reviewed and stable  Last Vitals:  Vitals Value Taken Time  BP 106/77 05/07/23 0924  Temp 36.1 C 05/07/23 0924  Pulse 86 05/07/23 0925  Resp 21 05/07/23 0925  SpO2 100 % 05/07/23 0925  Vitals shown include unfiled device data.  Last Pain:  Vitals:   05/07/23 0924  TempSrc: Temporal         Complications: No notable events documented.

## 2023-05-07 NOTE — Interval H&P Note (Signed)
History and Physical Interval Note:  05/07/2023 9:00 AM  Jeffery Hubbard  has presented today for surgery, with the diagnosis of BOWEL HABITS CHANGES,DIARRHEA.  The various methods of treatment have been discussed with the patient and family. After consideration of risks, benefits and other options for treatment, the patient has consented to  Procedure(s): COLONOSCOPY WITH PROPOFOL (N/A) as a surgical intervention.  The patient's history has been reviewed, patient examined, no change in status, stable for surgery.  I have reviewed the patient's chart and labs.  Questions were answered to the patient's satisfaction.     Regis Bill  Ok to proceed with colonoscopy

## 2023-05-07 NOTE — Anesthesia Procedure Notes (Signed)
Procedure Name: General with mask airway Date/Time: 05/07/2023 9:15 AM  Performed by: Mohammed Kindle, CRNAPre-anesthesia Checklist: Patient identified, Emergency Drugs available, Suction available and Patient being monitored Patient Re-evaluated:Patient Re-evaluated prior to induction Oxygen Delivery Method: Simple face mask Induction Type: IV induction Placement Confirmation: positive ETCO2, CO2 detector and breath sounds checked- equal and bilateral Dental Injury: Teeth and Oropharynx as per pre-operative assessment

## 2023-05-08 ENCOUNTER — Encounter: Payer: Self-pay | Admitting: Gastroenterology

## 2023-05-13 ENCOUNTER — Ambulatory Visit: Payer: Medicare Other | Attending: Surgery | Admitting: Occupational Therapy

## 2023-05-13 DIAGNOSIS — M79642 Pain in left hand: Secondary | ICD-10-CM | POA: Diagnosis present

## 2023-05-13 DIAGNOSIS — M25642 Stiffness of left hand, not elsewhere classified: Secondary | ICD-10-CM | POA: Diagnosis present

## 2023-05-13 DIAGNOSIS — M25532 Pain in left wrist: Secondary | ICD-10-CM | POA: Diagnosis present

## 2023-05-13 DIAGNOSIS — M25632 Stiffness of left wrist, not elsewhere classified: Secondary | ICD-10-CM | POA: Insufficient documentation

## 2023-05-13 DIAGNOSIS — M6281 Muscle weakness (generalized): Secondary | ICD-10-CM | POA: Insufficient documentation
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
## 2023-05-16 ENCOUNTER — Ambulatory Visit: Payer: Medicare Other | Admitting: Occupational Therapy

## 2023-05-16 DIAGNOSIS — M6281 Muscle weakness (generalized): Secondary | ICD-10-CM

## 2023-05-16 DIAGNOSIS — M25532 Pain in left wrist: Secondary | ICD-10-CM

## 2023-05-16 DIAGNOSIS — M79642 Pain in left hand: Secondary | ICD-10-CM | POA: Diagnosis not present

## 2023-05-16 DIAGNOSIS — M25632 Stiffness of left wrist, not elsewhere classified: Secondary | ICD-10-CM

## 2023-05-16 DIAGNOSIS — M25642 Stiffness of left hand, not elsewhere classified: Secondary | ICD-10-CM
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
## 2023-06-14 ENCOUNTER — Ambulatory Visit: Payer: Medicare Other | Attending: Surgery | Admitting: Occupational Therapy

## 2023-07-03 ENCOUNTER — Other Ambulatory Visit: Payer: Self-pay

## 2023-07-03 ENCOUNTER — Encounter: Payer: Self-pay | Admitting: *Deleted

## 2023-07-03 ENCOUNTER — Emergency Department
Admission: EM | Admit: 2023-07-03 | Discharge: 2023-07-03 | Disposition: A | Discharge status: Home / Self Care | Payer: Medicare Other | Attending: Emergency Medicine | Admitting: Emergency Medicine

## 2023-07-03 ENCOUNTER — Emergency Department: Payer: Medicare Other

## 2023-07-03 DIAGNOSIS — R0789 Other chest pain: Secondary | ICD-10-CM | POA: Insufficient documentation

## 2023-07-03 DIAGNOSIS — J449 Chronic obstructive pulmonary disease, unspecified: Secondary | ICD-10-CM | POA: Insufficient documentation

## 2023-07-03 DIAGNOSIS — I2510 Atherosclerotic heart disease of native coronary artery without angina pectoris: Secondary | ICD-10-CM | POA: Insufficient documentation

## 2023-07-03 DIAGNOSIS — R079 Chest pain, unspecified: Secondary | ICD-10-CM

## 2023-07-03 LAB — CBC
HCT: 43.4 % (ref 39.0–52.0)
Hemoglobin: 14.4 g/dL (ref 13.0–17.0)
MCH: 29.8 pg (ref 26.0–34.0)
MCHC: 33.2 g/dL (ref 30.0–36.0)
MCV: 89.9 fL (ref 80.0–100.0)
Platelets: 206 10*3/uL (ref 150–400)
RBC: 4.83 MIL/uL (ref 4.22–5.81)
RDW: 14.1 % (ref 11.5–15.5)
WBC: 7.1 10*3/uL (ref 4.0–10.5)
nRBC: 0 % (ref 0.0–0.2)

## 2023-07-03 LAB — BASIC METABOLIC PANEL
Anion gap: 10 (ref 5–15)
BUN: 10 mg/dL (ref 6–20)
CO2: 25 mmol/L (ref 22–32)
Calcium: 9.1 mg/dL (ref 8.9–10.3)
Chloride: 104 mmol/L (ref 98–111)
Creatinine, Ser: 0.93 mg/dL (ref 0.61–1.24)
GFR, Estimated: >60 mL/min (ref >60)
Glucose, Bld: 85 mg/dL (ref 70–99)
Potassium: 4.3 mmol/L (ref 3.5–5.1)
Sodium: 139 mmol/L (ref 135–145)

## 2023-07-03 LAB — TROPONIN I (HIGH SENSITIVITY)
Troponin I (High Sensitivity): 3 ng/L (ref <18)
Troponin I (High Sensitivity): 3 ng/L (ref <18)

## 2023-07-03 MED ORDER — ACETAMINOPHEN 500 MG PO TABS
1000.0000 mg | ORAL_TABLET | Freq: Once | ORAL | Status: AC
  Administered 2023-07-03: 1000 mg via ORAL
  Filled 2023-07-03: qty 2

## 2023-07-03 MED ORDER — LIDOCAINE 5 % EX PTCH
1.0000 | MEDICATED_PATCH | CUTANEOUS | Status: DC
  Administered 2023-07-03: 1 via TRANSDERMAL
  Filled 2023-07-03: qty 1

## 2023-07-03 NOTE — ED Triage Notes (Signed)
Pt arrives by [facility] from [facility] where he was for a class.  Pt is here for chest pain with radiation to left arm.  Hx of CAD and MI EKG and VS wnl for ems

## 2023-07-03 NOTE — ED Provider Notes (Signed)
[facility] Provider Note    Event Date/Time   First MD Initiated Contact with Patient 07/03/23 1730     (approximate)   History   Chest Pain   HPI  Jeffery Hubbard is a 48 y.o. male with history of CAD, MDD, anxiety, COPD, chronic back pain, kidney stones, presenting with left-sided chest pain that started earlier today.  States is mostly in the chest wall.  States that he was in [facility] for group session when he started having some pain.  Felt a nodule on his left breast that was hurting.  He denies any personal history of breast cancer, does have a grandmother who had breast cancer but no male members.  He denies any shortness of breath, fever, cough, abdominal pain, nausea, vomiting, diarrhea, leg pain.  He denies any trauma or falls, no recent URI infections.  States he is followed by cardiology, is compliant with his medication, has not had a stress test in a while.     Physical Exam   Triage Vital Signs: ED Triage Vitals [07/03/23 1153]  Encounter Vitals Group     BP 126/83     Systolic BP Percentile      Diastolic BP Percentile      Pulse Rate 93     Resp 18     Temp 98.1 F (36.7 C)     Temp Source Oral     SpO2 98 %     Weight      Height      Head Circumference      Peak Flow      Pain Score 6     Pain Loc      Pain Education      Exclude from Growth Chart     Most recent vital signs: Vitals:   07/03/23 1153 07/03/23 1625  BP: 126/83 (!) 121/96  Pulse: 93 80  Resp: 18 16  Temp: 98.1 F (36.7 C) 98 F (36.7 C)  SpO2: 98% 100%     General: Awake, no distress.  CV:  Good peripheral perfusion.  Resp:  Normal effort.  Clear to auscultation bilaterally Abd:  No distention.  Soft nontender Other:  He is tender to his left anterior lateral thoracic cage, he has 1/2 cm nodule at the 5 o'clock position on his left breast.  Is mildly tender, there is no overlying erythema, there is no rash or ecchymoses.   ED Results / Procedures /  Treatments   Labs (all labs ordered are listed, but only abnormal results are displayed) Labs Reviewed  BASIC METABOLIC PANEL  CBC  TROPONIN I (HIGH SENSITIVITY)  TROPONIN I (HIGH SENSITIVITY)     EKG  Normal sinus rhythm, rate 89, normal QRS, normal QTc, T wave flattening in aVL, no ischemic ST elevation, no ischemic ST depression, not significant change compared to prior  Repeat EKG, normal sinus rhythm, rate of 76, normal QRS, normal QTc, no ischemic ST elevations or depressions, there is T wave flattening in aVL.  Not changed from prior   RADIOLOGY Chest x-ray on my interpretation without focal consolidation   PROCEDURES:  Critical Care performed: No  Procedures   MEDICATIONS ORDERED IN ED: Medications  lidocaine (LIDODERM) 5 % 1 patch (1 patch Transdermal Patch Applied 07/03/23 1757)  acetaminophen (TYLENOL) tablet 1,000 mg (1,000 mg Oral Given 07/03/23 1756)     IMPRESSION / MDM / ASSESSMENT AND PLAN / ED COURSE  I reviewed the triage vital signs  and the nursing notes.                              Differential diagnosis includes, but is not limited to, ACS, musculoskeletal pain, costochondritis, calcification, mass, no rash to suggest zoster, no infectious symptoms to suggest URI or pneumonia.  He had an EKG as well as 2 tropes and labs and a chest x-ray done at triage.  Will get a repeat EKG here.  Will given some Tylenol as well as Lidoderm patch for the chest wall pain.  Patient's presentation is most consistent with acute presentation with potential threat to life or bodily function.  Independent review of labs and imaging, chest x-ray without focal consolidation, there is no obvious calcification in his breast tissue on the chest x-ray.  Trope x 2 is negative, x-rays not severely deranged, no leukocytosis, chest x-ray is nonischemic.  Will get a second EKG to make sure that there are no evolving changes.  Otherwise patient is stable and able to be discharged.     Repeat EKG is nonischemic and similar to prior.  Instructed him to follow-up with his cardiologist to get outpatient stress test done.  Also instructed follow-up with his primary care doctor for further management of that nodule felt on his left breast.  He will likely need an outpatient ultrasound.  Otherwise considered but no other indication for inpatient mission at this time, he is stable for outpatient management.  Strict return precautions given.  Will discharge.      FINAL CLINICAL IMPRESSION(S) / ED DIAGNOSES   Final diagnoses:  Chest pain, unspecified type     Rx / DC Orders   ED Discharge Orders     None        Note:  This document was prepared using Dragon voice recognition software and may include unintentional dictation errors.    Claybon Jabs, MD 07/03/23 [phone]

## 2023-07-03 NOTE — ED Provider Triage Note (Signed)
Emergency Medicine Provider Triage Evaluation Note  Jeffery Hubbard , a 48 y.o. male  was evaluated in triage.  Pt complains of cp, hx of stent done at [facility], pain feels similar to last mi.  Review of Systems  Positive:  Negative:   Physical Exam  BP 126/83   Pulse 93   Temp 98.1 F (36.7 C) (Oral)   Resp 18   SpO2 98%  Gen:   Awake, no distress   Resp:  Normal effort  MSK:   Moves extremities without difficulty  Other:    Medical Decision Making  Medically screening exam initiated at 11:58 AM.  Appropriate orders placed.  ALTA SHOBER was informed that the remainder of the evaluation will be completed by another provider, this initial triage assessment does not replace that evaluation, and the importance of remaining in the ED until their evaluation is complete.     Faythe Ghee, PA-C 07/03/23 1159

## 2023-07-03 NOTE — ED Triage Notes (Signed)
PA saw pt in triage

## 2023-07-03 NOTE — Discharge Instructions (Signed)
Please follow-up with your cardiologist for your symptoms and to get outpatient stress test done.  Please follow-up with your primary care doctor for further management and workup of your nodule in your breast.  Please return if you have persistent worsening symptoms, lightheadedness, sweating, have nausea or vomiting, or if you have any additional concerns.

## 2023-07-05 ENCOUNTER — Encounter: Payer: Self-pay | Admitting: Family Medicine

## 2023-07-08 ENCOUNTER — Other Ambulatory Visit: Payer: Self-pay | Admitting: Family Medicine

## 2023-07-08 DIAGNOSIS — N644 Mastodynia: Secondary | ICD-10-CM

## 2023-07-16 ENCOUNTER — Emergency Department
Admission: EM | Admit: 2023-07-16 | Discharge: 2023-07-17 | Disposition: A | Discharge status: Other Acute Inpatient Hospital | Payer: Medicare Other | Attending: Emergency Medicine | Admitting: Emergency Medicine

## 2023-07-16 DIAGNOSIS — F4381 Prolonged grief disorder: Secondary | ICD-10-CM | POA: Insufficient documentation

## 2023-07-16 DIAGNOSIS — F4310 Post-traumatic stress disorder, unspecified: Secondary | ICD-10-CM | POA: Diagnosis not present

## 2023-07-16 DIAGNOSIS — G8929 Other chronic pain: Secondary | ICD-10-CM | POA: Insufficient documentation

## 2023-07-16 DIAGNOSIS — F1210 Cannabis abuse, uncomplicated: Secondary | ICD-10-CM | POA: Diagnosis not present

## 2023-07-16 DIAGNOSIS — D72829 Elevated white blood cell count, unspecified: Secondary | ICD-10-CM | POA: Insufficient documentation

## 2023-07-16 DIAGNOSIS — J449 Chronic obstructive pulmonary disease, unspecified: Secondary | ICD-10-CM | POA: Diagnosis not present

## 2023-07-16 DIAGNOSIS — M5441 Lumbago with sciatica, right side: Secondary | ICD-10-CM | POA: Insufficient documentation

## 2023-07-16 DIAGNOSIS — R45851 Suicidal ideations: Secondary | ICD-10-CM | POA: Diagnosis not present

## 2023-07-16 DIAGNOSIS — Y906 Blood alcohol level of 120-199 mg/100 ml: Secondary | ICD-10-CM | POA: Diagnosis not present

## 2023-07-16 DIAGNOSIS — Z789 Other specified health status: Secondary | ICD-10-CM

## 2023-07-16 DIAGNOSIS — F332 Major depressive disorder, recurrent severe without psychotic features: Secondary | ICD-10-CM | POA: Diagnosis not present

## 2023-07-16 DIAGNOSIS — F32A Depression, unspecified: Secondary | ICD-10-CM | POA: Diagnosis present

## 2023-07-16 DIAGNOSIS — F329 Major depressive disorder, single episode, unspecified: Secondary | ICD-10-CM | POA: Diagnosis not present

## 2023-07-16 DIAGNOSIS — I2510 Atherosclerotic heart disease of native coronary artery without angina pectoris: Secondary | ICD-10-CM | POA: Insufficient documentation

## 2023-07-16 DIAGNOSIS — F1020 Alcohol dependence, uncomplicated: Secondary | ICD-10-CM | POA: Diagnosis not present

## 2023-07-16 DIAGNOSIS — F4321 Adjustment disorder with depressed mood: Secondary | ICD-10-CM | POA: Diagnosis not present

## 2023-07-16 LAB — CBC WITH DIFFERENTIAL/PLATELET
Abs Immature Granulocytes: 0.03 10*3/uL (ref 0.00–0.07)
Basophils Absolute: 0.1 10*3/uL (ref 0.0–0.1)
Basophils Relative: 1 %
Eosinophils Absolute: 0.1 10*3/uL (ref 0.0–0.5)
Eosinophils Relative: 1 %
HCT: 45.5 % (ref 39.0–52.0)
Hemoglobin: 15.2 g/dL (ref 13.0–17.0)
Immature Granulocytes: 0 %
Lymphocytes Relative: 22 %
Lymphs Abs: 2.7 10*3/uL (ref 0.7–4.0)
MCH: 30.2 pg (ref 26.0–34.0)
MCHC: 33.4 g/dL (ref 30.0–36.0)
MCV: 90.5 fL (ref 80.0–100.0)
Monocytes Absolute: 0.7 10*3/uL (ref 0.1–1.0)
Monocytes Relative: 5 %
Neutro Abs: 8.7 10*3/uL — ABNORMAL HIGH (ref 1.7–7.7)
Neutrophils Relative %: 71 %
Platelets: 221 10*3/uL (ref 150–400)
RBC: 5.03 MIL/uL (ref 4.22–5.81)
RDW: 13.6 % (ref 11.5–15.5)
WBC: 12.3 10*3/uL — ABNORMAL HIGH (ref 4.0–10.5)
nRBC: 0 % (ref 0.0–0.2)

## 2023-07-16 LAB — COMPREHENSIVE METABOLIC PANEL
ALT: 17 U/L (ref 0–44)
AST: 24 U/L (ref 15–41)
Albumin: 4.2 g/dL (ref 3.5–5.0)
Alkaline Phosphatase: 69 U/L (ref 38–126)
Anion gap: 13 (ref 5–15)
BUN: 8 mg/dL (ref 6–20)
CO2: 27 mmol/L (ref 22–32)
Calcium: 9.5 mg/dL (ref 8.9–10.3)
Chloride: 104 mmol/L (ref 98–111)
Creatinine, Ser: 0.83 mg/dL (ref 0.61–1.24)
GFR, Estimated: >60 mL/min (ref >60)
Glucose, Bld: 83 mg/dL (ref 70–99)
Potassium: 3.8 mmol/L (ref 3.5–5.1)
Sodium: 144 mmol/L (ref 135–145)
Total Bilirubin: 0.6 mg/dL (ref 0.0–1.2)
Total Protein: 7.9 g/dL (ref 6.5–8.1)

## 2023-07-16 LAB — URINE DRUG SCREEN, QUALITATIVE (ARMC ONLY)
Amphetamines, Ur Screen: NOT DETECTED
Barbiturates, Ur Screen: POSITIVE — AB
Benzodiazepine, Ur Scrn: NOT DETECTED
Cannabinoid 50 Ng, Ur ~~LOC~~: POSITIVE — AB
Cocaine Metabolite,Ur ~~LOC~~: NOT DETECTED
MDMA (Ecstasy)Ur Screen: NOT DETECTED
Methadone Scn, Ur: NOT DETECTED
Opiate, Ur Screen: NOT DETECTED
Phencyclidine (PCP) Ur S: NOT DETECTED
Tricyclic, Ur Screen: NOT DETECTED

## 2023-07-16 LAB — ACETAMINOPHEN LEVEL: Acetaminophen (Tylenol), Serum: <10 ug/mL — ABNORMAL LOW (ref 10–30)

## 2023-07-16 LAB — ETHANOL: Alcohol, Ethyl (B): 132 mg/dL — ABNORMAL HIGH (ref <10)

## 2023-07-16 LAB — SALICYLATE LEVEL: Salicylate Lvl: <7 mg/dL — ABNORMAL LOW (ref 7.0–30.0)

## 2023-07-16 MED ORDER — THIAMINE MONONITRATE 100 MG PO TABS
100.0000 mg | ORAL_TABLET | Freq: Every day | ORAL | Status: DC
  Administered 2023-07-16: 100 mg via ORAL
  Filled 2023-07-16: qty 1

## 2023-07-16 MED ORDER — LORAZEPAM 2 MG/ML IJ SOLN: 0.0000 mg | Freq: Two times a day (BID) | INTRAMUSCULAR | Status: DC

## 2023-07-16 MED ORDER — THIAMINE HCL 100 MG/ML IJ SOLN: 100.0000 mg | Freq: Every day | INTRAMUSCULAR | Status: DC

## 2023-07-16 MED ORDER — LORAZEPAM 2 MG PO TABS
0.0000 mg | ORAL_TABLET | Freq: Four times a day (QID) | ORAL | Status: DC
  Administered 2023-07-16 – 2023-07-17 (×2): 2 mg via ORAL
  Filled 2023-07-16 (×2): qty 1

## 2023-07-16 MED ORDER — LORAZEPAM 2 MG/ML IJ SOLN: 0.0000 mg | Freq: Four times a day (QID) | INTRAMUSCULAR | Status: DC

## 2023-07-16 MED ORDER — LORAZEPAM 2 MG PO TABS: 0.0000 mg | ORAL_TABLET | Freq: Two times a day (BID) | ORAL | Status: DC

## 2023-07-16 NOTE — ED Notes (Signed)
Patient had gold colored necklace with gold colored charm on.  Patient removed and gave to nurse to place in with belongings.  Necklace and charm placed in specimen cup and placed in belongings bag that is labeled with patient info.

## 2023-07-16 NOTE — ED Notes (Signed)
Pt was changed out into hospital required burgundy paper scrubs as per policy witnssed by this RN, NT French Ana, and AC LEO Janalee Dane). Items placed in belongings bag and a green label sticker placed on bags (2 bags total)  Black socks [employer] boots Blue jeans Navy blue T-shirt Heavy blue/black jacket [employer] belt [employer] boxer briefs  Education officer, museum given to security

## 2023-07-16 NOTE — ED Notes (Signed)
Pt. Alert and oriented, warm and dry, in no distress. Pt. Denies SI, HI, and AVH. Patient is tearful and states he was drinking today and stopped answering his phone. Family became worried especially with what happened at [facility].  Patient states he has been dealing with divorce and he mother in law recently passed away and was visiting the grave side and then went to brother's grave and was upset. Pt. Encouraged to let nursing staff know of any concerns or needs.   ENVIRONMENTAL ASSESSMENT Potentially harmful objects out of patient reach: Yes.   Personal belongings secured: Yes.   Patient dressed in hospital provided attire only: Yes.   Plastic bags out of patient reach: Yes.   Patient care equipment (cords, cables, call bells, lines, and drains) shortened, removed, or accounted for: Yes.   Equipment and supplies removed from bottom of stretcher: Yes.   Potentially toxic materials out of patient reach: Yes.   Sharps container removed or out of patient reach: Yes.
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
## 2023-07-16 NOTE — Consult Note (Incomplete)
[facility] Consult Note  Patient Name: Jeffery Hubbard MRN: [id number] DOB: 1976-05-16 DATE OF Consult: 07/16/2023  PRIMARY PSYCHIATRIC DIAGNOSES  1.  *** 2.  *** 3.  ***  RECOMMENDATIONS  Recommendations: Medication recommendations: ***  Non-Medication/therapeutic recommendations: ***  Is inpatient psychiatric hospitalization recommended for this patient? {Yes/No/why:304550013}  Follow-Up Telepsychiatry C/L services: We will sign off for now. Please re-consult our service if needed for any concerning changes in the patient's condition, discharge planning, or questions.   Communication: Treatment team members (and family members if applicable) who were involved in treatment/care discussions and planning, and with whom we spoke or engaged with via secure text/chat, include the following: ***  Thank you for involving [place] in the care of this patient. If you have any additional questions or concerns, please call [phone] and ask for me or the provider on-call.  TELEPSYCHIATRY ATTESTATION & CONSENT  As the provider for this telehealth consult, I attest that I verified the patient's identity using two separate identifiers, introduced myself to the patient, provided my credentials, disclosed my location, and performed this encounter via a HIPAA-compliant, real-time, face-to-face, two-way, interactive audio and video platform and with the full consent and agreement of the patient (or guardian as applicable.)  Patient physical location: ED in [facility]. Telehealth provider physical location: home office in state of [place]   Video start time: 2257 (Central Time) Video end time: *** (Central Time)  IDENTIFYING DATA  Jeffery Hubbard is a 48 y.o. year-old male for whom a psychiatric consultation has been ordered by the primary provider. The patient was identified using two separate identifiers.  CHIEF COMPLAINT/REASON FOR CONSULT  Suicidal Ideations   HISTORY  OF PRESENT ILLNESS (HPI)  The patient is a 48yo male who presented to the emergency department via law enforcement under IVC petitioned by [facility] for suicidal ideations. According to IVC papers, patient is "dealing with the effects of a hard divorce and told peer support group line that he had a gun and he was going to kill himself. He also told them that he wanted to go be with his brother, who has passed away". Reported to ED physician that he has a gun at home and has thoughts about killing himself.   BAL upon arrival 132 UDS + barbiturates and cannabis            PAST PSYCHIATRIC HISTORY   Otherwise as per HPI above.  PAST MEDICAL HISTORY  Past Medical History:  Diagnosis Date  . Anxiety   . Arthritis    left shoulder, right elbow  . Candidiasis of esophagus (HCC)   . Chronic pain   . COPD (chronic obstructive pulmonary disease) (HCC)   . Coronary artery disease   . Coronary atherosclerosis   . Depression   . GERD (gastroesophageal reflux disease)   . Headache    history of migraine  . History of kidney stones 2002  . Hypercholesterolemia   . Kidney stone   . MDD (major depressive disorder) 08/30/2021  . Moderate major depression (HCC) 01/16/2006  . Myocardial infarction (HCC) 04/2013  . OSA on CPAP 01/27/2020  . PONV (postoperative nausea and vomiting) 2003   with back surgery  . PTSD (post-traumatic stress disorder) 2011  . Right leg weakness    S/P back injury  . Rupture of ulnar collateral ligament of left thumb   . Sleep apnea    uses bipap  . Spinal cord stimulator status   . Tendinitis of left  wrist   . Wears dentures    partial upper   ***  HOME MEDICATIONS  Facility Ordered Medications  Medication  . LORazepam (ATIVAN) injection 0-4 mg   Or  . LORazepam (ATIVAN) tablet 0-4 mg  . [START ON 07/19/2023] LORazepam (ATIVAN) injection 0-4 mg   Or  . [START ON 07/19/2023] LORazepam (ATIVAN) tablet 0-4 mg  . thiamine (VITAMIN B1) tablet 100 mg   Or  .  thiamine (VITAMIN B1) injection 100 mg   PTA Medications  Medication Sig  . nitroGLYCERIN (NITROSTAT) 0.4 MG SL tablet Place 0.4 mg under the tongue every 5 (five) minutes as needed for chest pain.  Marland Kitchen aspirin EC 81 MG EC tablet Take 1 tablet (81 mg total) by mouth daily. Swallow whole.  Marland Kitchen atorvastatin (LIPITOR) 80 MG tablet Take 1 tablet (80 mg total) by mouth daily.  . pantoprazole (PROTONIX) 40 MG tablet Take 1 tablet (40 mg total) by mouth daily before breakfast.  . naloxone (NARCAN) nasal spray 4 mg/0.1 mL Place 1 spray into the nose as needed (For overdose of narcotics).  . baclofen (LIORESAL) 10 MG tablet Take 1 tablet (10 mg total) by mouth daily.  . pregabalin (LYRICA) 200 MG capsule Take 1 capsule (200 mg total) by mouth 3 (three) times daily.  . mometasone-formoterol (DULERA) 100-5 MCG/ACT AERO Inhale 2 puffs into the lungs 2 (two) times daily.  . VENTOLIN HFA 108 (90 Base) MCG/ACT inhaler Inhale 2 puffs into the lungs every 6 (six) hours as needed. For shortness of breath/wheezing.  . primidone (MYSOLINE) 50 MG tablet Take 50 mg by mouth 2 (two) times daily.  Marland Kitchen buPROPion HCl ER, XL, 450 MG TB24 Take 1 tablet (450 mg total) by mouth every morning.  . divalproex (DEPAKOTE ER) 500 MG 24 hr tablet Take 2 tablets (1,000 mg total) by mouth at bedtime.  . sertraline (ZOLOFT) 100 MG tablet Take 2 tablets (200 mg total) by mouth daily. Keep on file  . prazosin (MINIPRESS) 2 MG capsule Take 1 capsule (2 mg total) by mouth at bedtime.  . mirtazapine (REMERON) 15 MG tablet TAKE 1 TABLET BY MOUTH AT BEDTIME  . amLODipine (NORVASC) 5 MG tablet Take 5 mg by mouth daily.  . QUEtiapine (SEROQUEL) 50 MG tablet Take 50 mg by mouth as directed.  Marland Kitchen HYDROcodone-acetaminophen (NORCO/VICODIN) 5-325 MG tablet Take 2 tablets by mouth every 6 (six) hours as needed.  Marland Kitchen ibuprofen (ADVIL) 800 MG tablet Take 1 tablet (800 mg total) by mouth every 8 (eight) hours as needed.  . ondansetron (ZOFRAN-ODT) 4 MG  disintegrating tablet Take 1 tablet (4 mg total) by mouth every 6 (six) hours as needed for nausea or vomiting.  . tamsulosin (FLOMAX) 0.4 MG CAPS capsule Take 1 capsule (0.4 mg total) by mouth daily.  Marland Kitchen ketorolac (TORADOL) 10 MG tablet Take 1 tablet (10 mg total) by mouth every 6 (six) hours as needed for severe pain (pain score 7-10).  . metoprolol succinate (TOPROL-XL) 25 MG 24 hr tablet Take 25 mg by mouth daily.  . naproxen (NAPROSYN) 500 MG tablet Take 500 mg by mouth 2 (two) times daily with a meal.   ***  ALLERGIES  Allergies  Allergen Reactions  . Opana [Oxymorphone] Itching  . Topamax [Topiramate] Other (See Comments)    Severe headaches.    SOCIAL & SUBSTANCE USE HISTORY  Social History   Socioeconomic History  . Marital status: Married    Spouse name: Not on file  . Number of  children: Not on file  . Years of education: Not on file  . Highest education level: Not on file  Occupational History  . Not on file  Tobacco Use  . Smoking status: Every Day    Current packs/day: 0.00    Types: Cigarettes    Last attempt to quit: 04/2013    Years since quitting: 10.2    Passive exposure: Past  . Smokeless tobacco: Never  Vaping Use  . Vaping status: Every Day  Substance and Sexual Activity  . Alcohol use: Yes    Alcohol/week: 20.0 standard drinks of alcohol    Types: 20 Cans of beer per week  . Drug use: Yes    Types: Marijuana    Comment: daily  . Sexual activity: Yes  Other Topics Concern  . Not on file  Social History Narrative  . Not on file   Social Drivers of Health   Financial Resource Strain: Not on file  Food Insecurity: No Food Insecurity (04/06/2019)   Received from [facility], [facility]   Hunger Vital Sign   . Worried About Programme researcher, broadcasting/film/video in the Last Year: Never true   . Ran Out of Food in the Last Year: Never true  Transportation Needs: Not on file  Physical Activity: Not on file  Stress: Not on file  Social Connections: Not on  file   Social History   Tobacco Use  Smoking Status Every Day  . Current packs/day: 0.00  . Types: Cigarettes  . Last attempt to quit: 04/2013  . Years since quitting: 10.2  . Passive exposure: Past  Smokeless Tobacco Never   Social History   Substance and Sexual Activity  Alcohol Use Yes  . Alcohol/week: 20.0 standard drinks of alcohol  . Types: 20 Cans of beer per week   Social History   Substance and Sexual Activity  Drug Use Yes  . Types: Marijuana   Comment: daily    Additional pertinent information ***.  FAMILY HISTORY  History reviewed. No pertinent family history. Family Psychiatric History (if known):  ***  MENTAL STATUS EXAM (MSE)  Mental Status Exam: General Appearance: {Appearance:22683}  Orientation:  {BHH ORIENTATION (PAA):22689}  Memory:  {BHH MEMORY:22881}  Concentration:  {Concentration:21399}  Recall:  {BHH GOOD/FAIR/POOR:22877}  Attention  {BH Attention Span:31825}  Eye Contact:  {BHH EYE CONTACT:22684}  Speech:  {Speech:22685}  Language:  {BHH GOOD/FAIR/POOR:22877}  Volume:  {Volume (PAA):22686}  Mood: ***  Affect:  {Affect (PAA):22687}  Thought Process:  {Thought Process (PAA):22688}  Thought Content:  {Thought Content:22690}  Suicidal Thoughts:  {ST/HT (PAA):22692}  Homicidal Thoughts:  {ST/HT (PAA):22692}  Judgement:  {Judgement (PAA):22694}  Insight:  {Insight (PAA):22695}  Psychomotor Activity:  {Psychomotor (PAA):22696}  Akathisia:  {BHH YES OR NO:22294}  Fund of Knowledge:  {BHH GOOD/FAIR/POOR:22877}    Assets:  {Assets (PAA):22698}  Cognition:  {chl bhh cognition:304700322}  ADL's:  {BHH FAO'Z:30865}  AIMS (if indicated):       VITALS  Blood pressure (!) 124/93, pulse 95, temperature 98 F (36.7 C), temperature source Oral, resp. rate 18, height 5\' 8"  (1.727 m), weight 90.7 kg, SpO2 98%.  LABS  Admission on 07/16/2023  Component Date Value Ref Range Status  . Sodium 07/16/2023 144  135 - 145 mmol/L Final  . Potassium  07/16/2023 3.8  3.5 - 5.1 mmol/L Final  . Chloride 07/16/2023 104  98 - 111 mmol/L Final  . CO2 07/16/2023 27  22 - 32 mmol/L Final  . Glucose, Bld  07/16/2023 83  70 - 99 mg/dL Final   Glucose reference range applies only to samples taken after fasting for at least 8 hours.  . BUN 07/16/2023 8  6 - 20 mg/dL Final  . Creatinine, Ser 07/16/2023 0.83  0.61 - 1.24 mg/dL Final  . Calcium 16/03/9603 9.5  8.9 - 10.3 mg/dL Final  . Total Protein 07/16/2023 7.9  6.5 - 8.1 g/dL Final  . Albumin 54/02/8118 4.2  3.5 - 5.0 g/dL Final  . AST 14/78/2956 24  15 - 41 U/L Final  . ALT 07/16/2023 17  0 - 44 U/L Final  . Alkaline Phosphatase 07/16/2023 69  38 - 126 U/L Final  . Total Bilirubin 07/16/2023 0.6  0.0 - 1.2 mg/dL Final  . GFR, Estimated 07/16/2023 >60  >60 mL/min Final   Comment: (NOTE) Calculated using the CKD-EPI Creatinine Equation (2021)   . Anion gap 07/16/2023 13  5 - 15 Final   Performed at [facility], [address] [place] [place], [place] [address]  . Alcohol, Ethyl (B) 07/16/2023 132 (H)  <10 mg/dL Final   Comment: (NOTE) Lowest detectable limit for serum alcohol is 10 mg/dL.  For medical purposes only. Performed at [facility], [address] [place], [place] [address]   . Tricyclic, Ur Screen 07/16/2023 NONE DETECTED  NONE DETECTED Final  . Amphetamines, Ur Screen 07/16/2023 NONE DETECTED  NONE DETECTED Final  . MDMA (Ecstasy)Ur Screen 07/16/2023 NONE DETECTED  NONE DETECTED Final  . Cocaine Metabolite,Ur [place] 07/16/2023 NONE DETECTED  NONE DETECTED Final  . Opiate, Ur Screen 07/16/2023 NONE DETECTED  NONE DETECTED Final  . Phencyclidine (PCP) Ur S 07/16/2023 NONE DETECTED  NONE DETECTED Final  . Cannabinoid 50 Ng, Ur [place] 07/16/2023 POSITIVE (A)  NONE DETECTED Final  . Barbiturates, Ur Screen 07/16/2023 POSITIVE (A)  NONE DETECTED Final  . Benzodiazepine, Ur Scrn 07/16/2023 NONE DETECTED  NONE DETECTED Final  . Methadone Scn, Ur 07/16/2023 NONE DETECTED  NONE  DETECTED Final   Comment: (NOTE) Tricyclics + metabolites, urine    Cutoff 1000 ng/mL Amphetamines + metabolites, urine  Cutoff 1000 ng/mL MDMA (Ecstasy), urine              Cutoff 500 ng/mL Cocaine Metabolite, urine          Cutoff 300 ng/mL Opiate + metabolites, urine        Cutoff 300 ng/mL Phencyclidine (PCP), urine         Cutoff 25 ng/mL Cannabinoid, urine                 Cutoff 50 ng/mL Barbiturates + metabolites, urine  Cutoff 200 ng/mL Benzodiazepine, urine              Cutoff 200 ng/mL Methadone, urine                   Cutoff 300 ng/mL  The urine drug screen provides only a preliminary, unconfirmed analytical test result and should not be used for non-medical purposes. Clinical consideration and professional judgment should be applied to any positive drug screen result due to possible interfering substances. A more specific alternate chemical method must be used in order to obtain a confirmed analytical result. Gas chromatography / mass spectrometry (GC/MS) is the preferred confirm                          atory method. Performed at [facility], [address] [place],  [place] [address]   . WBC 07/16/2023 12.3 (H)  4.0 - 10.5 K/uL Final  . RBC 07/16/2023 5.03  4.22 - 5.81 MIL/uL Final  . Hemoglobin 07/16/2023 15.2  13.0 - 17.0 g/dL Final  . HCT 00/93/8182 45.5  39.0 - 52.0 % Final  . MCV 07/16/2023 90.5  80.0 - 100.0 fL Final  . MCH 07/16/2023 30.2  26.0 - 34.0 pg Final  . MCHC 07/16/2023 33.4  30.0 - 36.0 g/dL Final  . RDW 99/37/1696 13.6  11.5 - 15.5 % Final  . Platelets 07/16/2023 221  150 - 400 K/uL Final  . nRBC 07/16/2023 0.0  0.0 - 0.2 % Final  . Neutrophils Relative % 07/16/2023 71  % Final  . Neutro Abs 07/16/2023 8.7 (H)  1.7 - 7.7 K/uL Final  . Lymphocytes Relative 07/16/2023 22  % Final  . Lymphs Abs 07/16/2023 2.7  0.7 - 4.0 K/uL Final  . Monocytes Relative 07/16/2023 5  % Final  . Monocytes Absolute 07/16/2023 0.7  0.1 - 1.0 K/uL Final  .  Eosinophils Relative 07/16/2023 1  % Final  . Eosinophils Absolute 07/16/2023 0.1  0.0 - 0.5 K/uL Final  . Basophils Relative 07/16/2023 1  % Final  . Basophils Absolute 07/16/2023 0.1  0.0 - 0.1 K/uL Final  . Immature Granulocytes 07/16/2023 0  % Final  . Abs Immature Granulocytes 07/16/2023 0.03  0.00 - 0.07 K/uL Final   Performed at [facility], [address], [place], [place] [address]  . Acetaminophen (Tylenol), Serum 07/16/2023 <10 (L)  10 - 30 ug/mL Final   Comment: (NOTE) Therapeutic concentrations vary significantly. A range of 10-30 ug/mL  may be an effective concentration for many patients. However, some  are best treated at concentrations outside of this range. Acetaminophen concentrations >150 ug/mL at 4 hours after ingestion  and >50 ug/mL at 12 hours after ingestion are often associated with  toxic reactions.  Performed at [facility], [address] [place], [place] [address]   . Salicylate Lvl 07/16/2023 <7.0 (L)  7.0 - 30.0 mg/dL Final   Performed at [facility], [address] [place], [place] [address]    PSYCHIATRIC REVIEW OF SYSTEMS (ROS)  ROS: Notable for the following relevant positive findings: ROS  Additional findings:      Musculoskeletal: {Musculoskeletal neeeds/assessment:304550014}      Gait & Station: {Gait and Station:304550016}      Pain Screening: {Pain Description:304550015}      Nutrition & Dental Concerns: {Nutrition & Dental Concerns:304550017}  RISK FORMULATION/ASSESSMENT  Is the patient experiencing any suicidal or homicidal ideations: {yes/no:20286}       Explain if yes: *** Protective factors considered for safety management: ***  Risk factors/concerns considered for safety management: *** {CHL BH Risk Factors Safety Management:304550011}  Is there a safety management plan with the patient and treatment team to minimize risk factors and promote protective factors: {yes/no:20286}           Explain:  *** Is crisis care placement or psychiatric hospitalization recommended: {yes/no:20286}     Based on my current evaluation and risk assessment, patient is determined at this time to be at:  {Risk level:304550009}  *RISK ASSESSMENT Risk assessment is a dynamic process; it is possible that this patient's condition, and risk level, may change. This should be re-evaluated and managed over time as appropriate. Please re-consult psychiatric consult services if additional assistance is needed in terms of risk assessment and management. If your team decides to  discharge this patient, please advise the patient how to best access emergency psychiatric services, or to call 911, if their condition worsens or they feel unsafe in any way.   Assunta Gambles, NP Telepsychiatry Consult Services

## 2023-07-16 NOTE — ED Triage Notes (Signed)
Pt arrived with [facility] with IVC papers taken out by [facility] deputy [employer] for SI. According to IVC papers, stated that pt "dealing with the effects of a hard divorce and told peer support group line that he had a gun and he was going to kill himself. He also told them that he wanted to go be with his brother, who has passed away". Pt has conformed these statements as true. Pt admits to ETOH tonight, denies current drug use other then marijuana use yesterday. Officer remains with pt at this time.

## 2023-07-16 NOTE — ED Provider Notes (Signed)
[facility] Provider Note    Event Date/Time   First MD Initiated Contact with Patient 07/16/23 1937     (approximate)   History   IVC  SI   HPI  Jeffery Hubbard is a 48 y.o. male with history of depression, PTSD, [name], chronic pain, COPD, CAD, alcohol abuse, presenting with suicidal ideations.  Per [facility] deputy, patient was placed on IVC for SI.  Per the papers, he has been dealing with a hard divorce, told peers support group line that he had a gun and was going to kill himself.  States that he told them that he wanted to be with brother who had passed away.  Patient states that he does have a gun at home and his thoughts about killing himself.  Has been feeling down lately.  Has been drinking more alcohol.  States he drinks daily, had 7 beers today, last was this evening.  States that he does get a little bit of tremors but no seizures or DTs when he stops drinking alcohol.  States that he has not tried to hurt himself before.  No other symptoms.  History obtained from a PD as above.  Per independent chart review he is supposed to be on bupropion, Depakote, Remeron.     Physical Exam   Triage Vital Signs: ED Triage Vitals  Encounter Vitals Group     BP 07/16/23 1925 (!) 155/89     Systolic BP Percentile --      Diastolic BP Percentile --      Pulse Rate 07/16/23 1925 91     Resp 07/16/23 1925 18     Temp 07/16/23 1925 98 F (36.7 C)     Temp Source 07/16/23 1925 Oral     SpO2 07/16/23 1925 100 %     Weight 07/16/23 1924 200 lb (90.7 kg)     Height 07/16/23 1924 5\' 8"  (1.727 m)     Head Circumference --      Peak Flow --      Pain Score 07/16/23 1924 10     Pain Loc --      Pain Education --      Exclude from Growth Chart --     Most recent vital signs: Vitals:   07/16/23 1925  BP: (!) 155/89  Pulse: 91  Resp: 18  Temp: 98 F (36.7 C)  SpO2: 100%     General: Awake, no distress.  CV:  Good peripheral perfusion.  Resp:  Normal  effort.  Abd:  No distention.  Soft nontender Other:  Moving all 4 extremities, no tremors.   ED Results / Procedures / Treatments   Labs (all labs ordered are listed, but only abnormal results are displayed) Labs Reviewed  ETHANOL - Abnormal; Notable for the following components:      Result Value   Alcohol, Ethyl (B) 132 (*)    All other components within normal limits  URINE DRUG SCREEN, QUALITATIVE ([facility] ONLY) - Abnormal; Notable for the following components:   Cannabinoid 50 Ng, Ur [place] POSITIVE (*)    Barbiturates, Ur Screen POSITIVE (*)    All other components within normal limits  CBC WITH DIFFERENTIAL/PLATELET - Abnormal; Notable for the following components:   WBC 12.3 (*)    Neutro Abs 8.7 (*)    All other components within normal limits  ACETAMINOPHEN LEVEL - Abnormal; Notable for the following components:   Acetaminophen (Tylenol), Serum <10 (*)    All  other components within normal limits  SALICYLATE LEVEL - Abnormal; Notable for the following components:   Salicylate Lvl <7.0 (*)    All other components within normal limits  COMPREHENSIVE METABOLIC PANEL     PROCEDURES:  Critical Care performed: No  Procedures   MEDICATIONS ORDERED IN ED: Medications  LORazepam (ATIVAN) injection 0-4 mg (has no administration in time range)    Or  LORazepam (ATIVAN) tablet 0-4 mg (has no administration in time range)  LORazepam (ATIVAN) injection 0-4 mg (has no administration in time range)    Or  LORazepam (ATIVAN) tablet 0-4 mg (has no administration in time range)  thiamine (VITAMIN B1) tablet 100 mg (has no administration in time range)    Or  thiamine (VITAMIN B1) injection 100 mg (has no administration in time range)     IMPRESSION / MDM / ASSESSMENT AND PLAN / ED COURSE  I reviewed the triage vital signs and the nursing notes.                              Differential diagnosis includes, but is not limited to, suicidal ideations, decompensated psych,  alcohol use, no evidence of withdrawal at this time.  Will get labs, salicylate, Tylenol, ethanol level.  Will plan to medically clear him for psychiatric evaluation.  Patient's presentation is most consistent with acute presentation with potential threat to life or bodily function.  Independent review of labs below, his Tylenol and salicylate close are also not elevated.  Patient is medically cleared for psychiatric evaluation.  Also place ER protocol since he drinks daily and has history of tremors when he stops drinking.  Clinical Course as of 07/16/23 2109  Tue Jul 16, 2023  2035 Urine Drug Screen, Qualitative(!) Positive for barbiturates as well as cannabinoids. [TT]  2035 Alcohol, Ethyl (B)(!): 132 Elevated [TT]  2035 Otherwise electrolytes not severely deranged, he is a mild leukocytosis but no infectious symptoms, this is nonspecific. [TT]    Clinical Course User Index [TT] Jodie Echevaria Franchot Erichsen, MD     FINAL CLINICAL IMPRESSION(S) / ED DIAGNOSES   Final diagnoses:  Suicidal ideation  Alcohol use     Rx / DC Orders   ED Discharge Orders     None        Note:  This document was prepared using Dragon voice recognition software and may include unintentional dictation errors.    Claybon Jabs, MD 07/16/23 2109

## 2023-07-16 NOTE — Consult Note (Cosign Needed Addendum)
[facility] Consult Note  Patient Name: Jeffery Hubbard MRN: [id number] DOB: [birth year] DATE OF Consult: 07/16/2023  PRIMARY PSYCHIATRIC DIAGNOSES  1.  Major Depressive Disorder, Recurrent, Severe, without Psychotic Features 2. Alcohol Use Disorder, Moderate, Dependence  3. Grief 4. R/O Adjustment Disorder   RECOMMENDATIONS  Recommendations: Medication recommendations: Please reconcile home medication regimen to confirm medications and dosages. Patient is unable to provide this information at this time.   -- Zyprexa 10mg  PO/IM Q6H PRN for acute agitation  -- CIWA monitoring: Ativan 1mg  PO Q4H PRN for CIWA above 8  Non-Medication/therapeutic recommendations: Suicide precautions, uphold IVC petition, inpatient psychiatric admission  Is inpatient psychiatric hospitalization recommended for this patient? Yes- patient with increasing depression, anxiety and passive SI with plan to drive his vehicle off a bridge while intoxicated and other plans that he is not willing to elaborate on. Today he brought a gun with him to the graveyard; normally does not leave the house with his gun.   Follow-Up Telepsychiatry C/L services: We will sign off for now. Please re-consult our service if needed for any concerning changes in the patient's condition, discharge planning, or questions.   Communication: Treatment team members (and family members if applicable) who were involved in treatment/care discussions and planning, and with whom we spoke or engaged with via secure text/chat, include the following: ED primary team   Thank you for involving [place] in the care of this patient. If you have any additional questions or concerns, please call [phone] and ask for me or the provider on-call.  TELEPSYCHIATRY ATTESTATION & CONSENT  As the provider for this telehealth consult, I attest that I verified the patient's identity using two separate identifiers, introduced myself to the patient, provided my  credentials, disclosed my location, and performed this encounter via a HIPAA-compliant, real-time, face-to-face, two-way, interactive audio and video platform and with the full consent and agreement of the patient (or guardian as applicable.)  Patient physical location: ED in [facility]. Telehealth provider physical location: home office in state of [place]   Video start time: 230[facility] Time) Video end time: 2330 [facility] Time)  IDENTIFYING DATA  Jeffery Hubbard is a 48 y.o. year-old male for whom a psychiatric consultation has been ordered by the primary provider. The patient was identified using two separate identifiers.  CHIEF COMPLAINT/REASON FOR CONSULT  Suicidal Ideations   HISTORY OF PRESENT ILLNESS (HPI)  The patient is a 48yo male who presented to the emergency department via law enforcement under IVC petitioned by [facility] for suicidal ideations. According to IVC papers, patient is "dealing with the effects of a hard divorce and told peer support group line that he had a gun and he was going to kill himself. He also told them that he wanted to go be with his brother, who has passed away". Reported to ED physician that he has a gun at home and has thoughts about killing himself.   BAL upon arrival 132 UDS + barbiturates and cannabis  Upon interview, patient is calm and cooperative, alert and oriented x4. No agitation observed. Behavior appropriate, poor eye contact. Patient shares he is going through a divorce. Recently his ex-wife's grandmother passed away. He went down to the graveyard today to visit. Grandmother is buried in the same area as patient's brother who passed away from a MVA, involving the patient, at 50yo. Patient describes survivor's guilt. Patient states he was "talking to my brother, got the drinking and one thing led to another".  Patient states he had called his peer support specialist and told her "I am done. I don't have the will to live  anymore". Patient confirms he feels this way. He actually brought a gun with him to the graveyard. Patient states he normally does not bring his gun with him out in public; however he can't provide a reason as to why he brought the gun to the graveyard with him. He denies planning to kill himself, denies intent "I just had it". However does admit he has been experiencing suicidal ideations with a plan to drink and drive his car off a bridge and "other ways" in which he is not willing to discuss. Patient states it "makes me ill that I can't kill myself"; states he thinks about losing someone, thinks about his mother and children. Police reportedly confiscated his gun.   Patient admits to drinking 8 beers today. States his alcohol intake has been increasing due to recent stress and reports self-medicating with alcohol. Denies daily consumption but reports various amounts weekly. No history of detox/ rehab. No history of DWI. Denies drug use. Denies history of seizures.   Patient reports history of depression since 1994. States if he tried to kill himself "knowing my luck I'd end up a vegetable". No homicidal ideations. No active SI/HI. No psychosis present. No symptoms indicative of mania. Patient reports sporadic compliance with medications and treatment as of lately. States he doesn't take his medication when he drinks "a lot". When asked if he thinks he has been drinking a lot he replies "yeah just about".     PAST PSYCHIATRIC HISTORY  Past psychiatric diagnoses: MDD, Cannabis Use Disorder, PTSD, TBI Inpatient Hospitalizations: Yes 2-3 past admissions per chart review  Outpatient Treatment: [facility]: Psychiatrist, Therapist, Peer Support Specialist Psychiatric Medications: Sporadic compliance. Hx. Suicide Attempt- Denies  Per chart review, patient presented to the ED 03/05/2023 with increasing depressive thoughts and suicidal ideations. Patient evaluated by psychiatry and deemed appropriate to return home  with outpatient psychiatric services and follow-up.     Otherwise as per HPI above.  PAST MEDICAL HISTORY  Past Medical History:  Diagnosis Date   Anxiety    Arthritis    left shoulder, right elbow   Candidiasis of esophagus (HCC)    Chronic pain    COPD (chronic obstructive pulmonary disease) (HCC)    Coronary artery disease    Coronary atherosclerosis    Depression    GERD (gastroesophageal reflux disease)    Headache    history of migraine   History of kidney stones 2002   Hypercholesterolemia    Kidney stone    MDD (major depressive disorder) 08/30/2021   Moderate major depression (HCC) 01/16/2006   Myocardial infarction (HCC) 04/2013   OSA on CPAP 01/27/2020   PONV (postoperative nausea and vomiting) 2003   with back surgery   PTSD (post-traumatic stress disorder) 2011   Right leg weakness    S/P back injury   Rupture of ulnar collateral ligament of left thumb    Sleep apnea    uses bipap   Spinal cord stimulator status    Tendinitis of left wrist    Wears dentures    partial upper     HOME MEDICATIONS  Facility Ordered Medications  Medication   LORazepam (ATIVAN) injection 0-4 mg   Or   LORazepam (ATIVAN) tablet 0-4 mg   [START ON 07/19/2023] LORazepam (ATIVAN) injection 0-4 mg   Or   [START ON 07/19/2023] LORazepam (ATIVAN) tablet 0-4 mg  thiamine (VITAMIN B1) tablet 100 mg   Or   thiamine (VITAMIN B1) injection 100 mg   PTA Medications  Medication Sig   nitroGLYCERIN (NITROSTAT) 0.4 MG SL tablet Place 0.4 mg under the tongue every 5 (five) minutes as needed for chest pain.   aspirin EC 81 MG EC tablet Take 1 tablet (81 mg total) by mouth daily. Swallow whole.   atorvastatin (LIPITOR) 80 MG tablet Take 1 tablet (80 mg total) by mouth daily.   pantoprazole (PROTONIX) 40 MG tablet Take 1 tablet (40 mg total) by mouth daily before breakfast.   naloxone (NARCAN) nasal spray 4 mg/0.1 mL Place 1 spray into the nose as needed (For overdose of narcotics).    baclofen (LIORESAL) 10 MG tablet Take 1 tablet (10 mg total) by mouth daily.   pregabalin (LYRICA) 200 MG capsule Take 1 capsule (200 mg total) by mouth 3 (three) times daily.   mometasone-formoterol (DULERA) 100-5 MCG/ACT AERO Inhale 2 puffs into the lungs 2 (two) times daily.   VENTOLIN HFA 108 (90 Base) MCG/ACT inhaler Inhale 2 puffs into the lungs every 6 (six) hours as needed. For shortness of breath/wheezing.   primidone (MYSOLINE) 50 MG tablet Take 50 mg by mouth 2 (two) times daily.   buPROPion HCl ER, XL, 450 MG TB24 Take 1 tablet (450 mg total) by mouth every morning.   divalproex (DEPAKOTE ER) 500 MG 24 hr tablet Take 2 tablets (1,000 mg total) by mouth at bedtime.   sertraline (ZOLOFT) 100 MG tablet Take 2 tablets (200 mg total) by mouth daily. Keep on file   prazosin (MINIPRESS) 2 MG capsule Take 1 capsule (2 mg total) by mouth at bedtime.   mirtazapine (REMERON) 15 MG tablet TAKE 1 TABLET BY MOUTH AT BEDTIME   amLODipine (NORVASC) 5 MG tablet Take 5 mg by mouth daily.   QUEtiapine (SEROQUEL) 50 MG tablet Take 50 mg by mouth as directed.   HYDROcodone-acetaminophen (NORCO/VICODIN) 5-325 MG tablet Take 2 tablets by mouth every 6 (six) hours as needed.   ibuprofen (ADVIL) 800 MG tablet Take 1 tablet (800 mg total) by mouth every 8 (eight) hours as needed.   ondansetron (ZOFRAN-ODT) 4 MG disintegrating tablet Take 1 tablet (4 mg total) by mouth every 6 (six) hours as needed for nausea or vomiting.   tamsulosin (FLOMAX) 0.4 MG CAPS capsule Take 1 capsule (0.4 mg total) by mouth daily.   ketorolac (TORADOL) 10 MG tablet Take 1 tablet (10 mg total) by mouth every 6 (six) hours as needed for severe pain (pain score 7-10).   metoprolol succinate (TOPROL-XL) 25 MG 24 hr tablet Take 25 mg by mouth daily.   naproxen (NAPROSYN) 500 MG tablet Take 500 mg by mouth 2 (two) times daily with a meal.     ALLERGIES  Allergies  Allergen Reactions   Opana [Oxymorphone] Itching   Topamax [Topiramate]  Other (See Comments)    Severe headaches.    SOCIAL & SUBSTANCE USE HISTORY  Social History   Socioeconomic History   Marital status: Married    Spouse name: Not on file   Number of children: Not on file   Years of education: Not on file   Highest education level: Not on file  Occupational History   Not on file  Tobacco Use   Smoking status: Every Day    Current packs/day: 0.00    Types: Cigarettes    Last attempt to quit: 04/2013    Years since quitting: 10.2  Passive exposure: Past   Smokeless tobacco: Never  Vaping Use   Vaping status: Every Day  Substance and Sexual Activity   Alcohol use: Yes    Alcohol/week: 20.0 standard drinks of alcohol    Types: 20 Cans of beer per week   Drug use: Yes    Types: Marijuana    Comment: daily   Sexual activity: Yes  Other Topics Concern   Not on file  Social History Narrative   Not on file   Social Drivers of Health   Financial Resource Strain: Not on file  Food Insecurity: No Food Insecurity (04/06/2019)   Received from [facility]   Hunger Vital Sign    Worried About Running Out of Food in the Last Year: Never true    Ran Out of Food in the Last Year: Never true  Transportation Needs: Not on file  Physical Activity: Not on file  Stress: Not on file  Social Connections: Not on file   Social History   Tobacco Use  Smoking Status Every Day   Current packs/day: 0.00   Types: Cigarettes   Last attempt to quit: 04/2013   Years since quitting: 10.2   Passive exposure: Past  Smokeless Tobacco Never   Social History   Substance and Sexual Activity  Alcohol Use Yes   Alcohol/week: 20.0 standard drinks of alcohol   Types: 20 Cans of beer per week   Social History   Substance and Sexual Activity  Drug Use Yes   Types: Marijuana   Comment: daily    Additional pertinent information: Lives alone. Unemployment.    FAMILY HISTORY  History reviewed. No pertinent family history. Family  Psychiatric History (if known):  None    MENTAL STATUS EXAM (MSE)  Mental Status Exam: General Appearance: Fairly Groomed and hospital scrubs  Orientation:  Full (Time, Place, and Person)  Memory:  Recent;   Fair  Concentration:  Concentration: Fair  Recall:  Fair  Attention  Good  Eye Contact:  Poor  Speech:  Clear and Coherent  Language:  Good  Volume:  Decreased  Mood: Depressed   Affect:  Flat  Thought Process:  Linear  Thought Content:  Logical  Suicidal Thoughts:  Yes.  with intent/plan  Homicidal Thoughts:  No  Judgement:  Poor  Insight:  Lacking  Psychomotor Activity:  Normal  Akathisia:  No  Fund of Knowledge:  Fair    Assets:  Games developer Social Support  Cognition:  WNL  ADL's:  Intact  AIMS (if indicated):       VITALS  Blood pressure (!) 124/93, pulse 95, temperature 98 F (36.7 C), temperature source Oral, resp. rate 18, height 5\' 8"  (1.727 m), weight 90.7 kg, SpO2 98%.  LABS  Admission on 07/16/2023  Component Date Value Ref Range Status   Sodium 07/16/2023 144  135 - 145 mmol/L Final   Potassium 07/16/2023 3.8  3.5 - 5.1 mmol/L Final   Chloride 07/16/2023 104  98 - 111 mmol/L Final   CO2 07/16/2023 27  22 - 32 mmol/L Final   Glucose, Bld 07/16/2023 83  70 - 99 mg/dL Final   Glucose reference range applies only to samples taken after fasting for at least 8 hours.   BUN 07/16/2023 8  6 - 20 mg/dL Final   Creatinine, Ser 07/16/2023 0.83  0.61 - 1.24 mg/dL Final   Calcium 16/03/9603 9.5  8.9 - 10.3 mg/dL Final   Total Protein 54/02/8118  7.9  6.5 - 8.1 g/dL Final   Albumin 09/81/1914 4.2  3.5 - 5.0 g/dL Final   AST 78/29/5621 24  15 - 41 U/L Final   ALT 07/16/2023 17  0 - 44 U/L Final   Alkaline Phosphatase 07/16/2023 69  38 - 126 U/L Final   Total Bilirubin 07/16/2023 0.6  0.0 - 1.2 mg/dL Final   GFR, Estimated 07/16/2023 >60  >60 mL/min Final   Comment: (NOTE) Calculated using the CKD-EPI Creatinine Equation  (2021)    Anion gap 07/16/2023 13  5 - 15 Final   Performed at [facility], [address] [place], [place] [address]   Alcohol, Ethyl (B) 07/16/2023 132 (H)  <10 mg/dL Final   Comment: (NOTE) Lowest detectable limit for serum alcohol is 10 mg/dL.  For medical purposes only. Performed at [facility], [address] [place], [place] [address]    Tricyclic, Ur Screen 07/16/2023 NONE DETECTED  NONE DETECTED Final   Amphetamines, Ur Screen 07/16/2023 NONE DETECTED  NONE DETECTED Final   MDMA (Ecstasy)Ur Screen 07/16/2023 NONE DETECTED  NONE DETECTED Final   Cocaine Metabolite,Ur [place] 07/16/2023 NONE DETECTED  NONE DETECTED Final   Opiate, Ur Screen 07/16/2023 NONE DETECTED  NONE DETECTED Final   Phencyclidine (PCP) Ur S 07/16/2023 NONE DETECTED  NONE DETECTED Final   Cannabinoid 50 Ng, Ur [place] 07/16/2023 POSITIVE (A)  NONE DETECTED Final   Barbiturates, Ur Screen 07/16/2023 POSITIVE (A)  NONE DETECTED Final   Benzodiazepine, Ur Scrn 07/16/2023 NONE DETECTED  NONE DETECTED Final   Methadone Scn, Ur 07/16/2023 NONE DETECTED  NONE DETECTED Final   Comment: (NOTE) Tricyclics + metabolites, urine    Cutoff 1000 ng/mL Amphetamines + metabolites, urine  Cutoff 1000 ng/mL MDMA (Ecstasy), urine              Cutoff 500 ng/mL Cocaine Metabolite, urine          Cutoff 300 ng/mL Opiate + metabolites, urine        Cutoff 300 ng/mL Phencyclidine (PCP), urine         Cutoff 25 ng/mL Cannabinoid, urine                 Cutoff 50 ng/mL Barbiturates + metabolites, urine  Cutoff 200 ng/mL Benzodiazepine, urine              Cutoff 200 ng/mL Methadone, urine                   Cutoff 300 ng/mL  The urine drug screen provides only a preliminary, unconfirmed analytical test result and should not be used for non-medical purposes. Clinical consideration and professional judgment should be applied to any positive drug screen result due to possible interfering substances. A more specific  alternate chemical method must be used in order to obtain a confirmed analytical result. Gas chromatography / mass spectrometry (GC/MS) is the preferred confirm                          atory method. Performed at [facility], [address] [place], [place] [address]    WBC 07/16/2023 12.3 (H)  4.0 - 10.5 K/uL Final   RBC 07/16/2023 5.03  4.22 - 5.81 MIL/uL Final   Hemoglobin 07/16/2023 15.2  13.0 - 17.0 g/dL Final   HCT 84/13/2440 45.5  39.0 - 52.0 % Final   MCV 07/16/2023 90.5  80.0 - 100.0 fL Final   [facility] 07/16/2023  30.2  26.0 - 34.0 pg Final   MCHC 07/16/2023 33.4  30.0 - 36.0 g/dL Final   RDW 09/81/1914 13.6  11.5 - 15.5 % Final   Platelets 07/16/2023 221  150 - 400 K/uL Final   nRBC 07/16/2023 0.0  0.0 - 0.2 % Final   Neutrophils Relative % 07/16/2023 71  % Final   Neutro Abs 07/16/2023 8.7 (H)  1.7 - 7.7 K/uL Final   Lymphocytes Relative 07/16/2023 22  % Final   Lymphs Abs 07/16/2023 2.7  0.7 - 4.0 K/uL Final   Monocytes Relative 07/16/2023 5  % Final   Monocytes Absolute 07/16/2023 0.7  0.1 - 1.0 K/uL Final   Eosinophils Relative 07/16/2023 1  % Final   Eosinophils Absolute 07/16/2023 0.1  0.0 - 0.5 K/uL Final   Basophils Relative 07/16/2023 1  % Final   Basophils Absolute 07/16/2023 0.1  0.0 - 0.1 K/uL Final   Immature Granulocytes 07/16/2023 0  % Final   Abs Immature Granulocytes 07/16/2023 0.03  0.00 - 0.07 K/uL Final   Performed at [facility], [address] [place], [place] [address]   Acetaminophen (Tylenol), Serum 07/16/2023 <10 (L)  10 - 30 ug/mL Final   Comment: (NOTE) Therapeutic concentrations vary significantly. A range of 10-30 ug/mL  may be an effective concentration for many patients. However, some  are best treated at concentrations outside of this range. Acetaminophen concentrations >150 ug/mL at 4 hours after ingestion  and >50 ug/mL at 12 hours after ingestion are often associated with  toxic reactions.  Performed at [facility], [address] [place], [place] [address]    Salicylate Lvl 07/16/2023 <7.0 (L)  7.0 - 30.0 mg/dL Final   Performed at [facility], [address] [place], [place] [address]    PSYCHIATRIC REVIEW OF SYSTEMS (ROS)  ROS: Notable for the following relevant positive findings: Review of Systems  Psychiatric/Behavioral:  Positive for depression, substance abuse and suicidal ideas. Negative for hallucinations. The patient is nervous/anxious.     Additional findings:      Musculoskeletal: No abnormal movements observed      Gait & Station: Laying/Sitting      Pain Screening: Denies      Nutrition & Dental Concerns: No concerns at this time discussed   RISK FORMULATION/ASSESSMENT  Is the patient experiencing any suicidal or homicidal ideations: Yes       Explain if yes: Passive SI with plan; went to graveyard today with gun   Protective factors considered for safety management: access to care, family support, housing  Risk factors/concerns considered for safety management: divorced Depression Substance abuse/dependence Recent loss Access to lethal means Hopelessness Unwillingness to seek help Male gender  Is there a safety management plan with the patient and treatment team to minimize risk factors and promote protective factors: Yes           Explain: Currently in the ED, medication management, comfort measures, suicide precautions, uphold IVC, inpatient hospitalization  Is crisis care placement or psychiatric hospitalization recommended: Yes     Based on my current evaluation and risk assessment, patient is determined at this time to be at:  High risk  *RISK ASSESSMENT Risk assessment is a dynamic process; it is possible that this patient's condition, and risk level, may change. This should be re-evaluated and managed over time as appropriate. Please re-consult psychiatric consult services if additional assistance is needed in terms of risk assessment and  management. If your team  decides to discharge this patient, please advise the patient how to best access emergency psychiatric services, or to call 911, if their condition worsens or they feel unsafe in any way.   Assunta Gambles, NP Telepsychiatry Consult Services

## 2023-07-17 DIAGNOSIS — R45851 Suicidal ideations: Secondary | ICD-10-CM

## 2023-07-17 DIAGNOSIS — F329 Major depressive disorder, single episode, unspecified: Secondary | ICD-10-CM | POA: Diagnosis not present

## 2023-07-17 DIAGNOSIS — F1020 Alcohol dependence, uncomplicated: Secondary | ICD-10-CM

## 2023-07-17 MED ORDER — OLANZAPINE 10 MG IM SOLR: 10.0000 mg | Freq: Once | INTRAMUSCULAR | Status: DC | PRN

## 2023-07-17 MED ORDER — OLANZAPINE 5 MG PO TBDP: 5.0000 mg | ORAL_TABLET | Freq: Four times a day (QID) | ORAL | Status: DC | PRN

## 2023-07-17 MED ORDER — OLANZAPINE 5 MG PO TBDP: 10.0000 mg | ORAL_TABLET | Freq: Four times a day (QID) | ORAL | Status: DC | PRN

## 2023-07-17 NOTE — ED Notes (Signed)
Accepted to [facility] after 8 this Am

## 2023-07-17 NOTE — ED Notes (Signed)
Breakfast tray provided for pt.

## 2023-07-17 NOTE — ED Notes (Signed)
recommendations: Suicide precautions, uphold IVC petition, inpatient psychiatric admission

## 2023-07-17 NOTE — ED Notes (Signed)
EMTALA reviewed by this RN.

## 2023-07-17 NOTE — BH Assessment (Signed)
Patient has been accepted to [facility].  Patient assigned to 2 [facility]. Accepting physician is Dr. Shanon Payor.  Call report to [phone].  Representative was [employer].   ER Staff is aware of it:  Rivka Barbara, ER Secretary  Dr. Dolores Frame, ER MD  Selena Batten, Patient's Nurse     Patient can arrive 07/17/23 anytime after 8 AM.

## 2023-07-17 NOTE — ED Provider Notes (Signed)
Emergency Medicine Observation Re-evaluation Note  Jeffery Hubbard is a 48 y.o. male, seen on rounds today.  Pt initially presented to the ED for complaints of IVC  SI Currently, the patient is resting, voices no medical complaints.  Physical Exam  BP (!) 124/93 (BP Location: Left Arm)   Pulse 95   Temp 98 F (36.7 C) (Oral)   Resp 18   Ht 5\' 8"  (1.727 m)   Wt 90.7 kg   SpO2 98%   BMI 30.41 kg/m  Physical Exam General: Resting in no acute distress Cardiac: No cyanosis Lungs: Equal rise and fall Psych: Not agitated  ED Course / MDM  EKG:   I have reviewed the labs performed to date as well as medications administered while in observation.  Recent changes in the last 24 hours include no events overnight.  Plan  Current plan is for psychiatric disposition.    Irean Hong, MD 07/17/23 0530

## 2023-07-17 NOTE — BH Assessment (Signed)
Per [facility] Selena Batten), patient to be referred out of system.  Referral information for Psychiatric Hospitalization faxed to;   [facility] [phone]) No appropriate beds available  [facility] [phone]- [phone]),   [facility] [phone]  [facility] [phone]),  [address] [phone]),   [facility] [phone]- [phone]),   [facility] [phone]),  [place] [phone])  [place] [phone] or [phone]),

## 2023-07-22 ENCOUNTER — Inpatient Hospital Stay: Admission: RE | Admit: 2023-07-22 | Payer: Medicare Other | Source: Ambulatory Visit

## 2023-07-22 ENCOUNTER — Other Ambulatory Visit: Payer: Medicare Other

## 2023-08-05 ENCOUNTER — Encounter: Payer: Self-pay | Admitting: Gastroenterology

## 2023-09-04 ENCOUNTER — Ambulatory Visit
Admission: RE | Admit: 2023-09-04 | Discharge: 2023-09-04 | Disposition: A | Discharge status: Home / Self Care | Source: Ambulatory Visit | Attending: Family Medicine | Admitting: Family Medicine

## 2023-09-04 DIAGNOSIS — N644 Mastodynia: Secondary | ICD-10-CM | POA: Insufficient documentation

## 2023-11-01 ENCOUNTER — Encounter: Payer: Self-pay | Admitting: Emergency Medicine

## 2023-11-01 ENCOUNTER — Ambulatory Visit (INDEPENDENT_AMBULATORY_CARE_PROVIDER_SITE_OTHER)

## 2023-11-01 ENCOUNTER — Ambulatory Visit
Admission: EM | Admit: 2023-11-01 | Discharge: 2023-11-01 | Disposition: A | Discharge status: Home / Self Care | Attending: Family Medicine | Admitting: Family Medicine

## 2023-11-01 DIAGNOSIS — N200 Calculus of kidney: Secondary | ICD-10-CM | POA: Insufficient documentation

## 2023-11-01 DIAGNOSIS — R109 Unspecified abdominal pain: Secondary | ICD-10-CM

## 2023-11-01 DIAGNOSIS — R82998 Other abnormal findings in urine: Secondary | ICD-10-CM | POA: Insufficient documentation

## 2023-11-01 DIAGNOSIS — R310 Gross hematuria: Secondary | ICD-10-CM | POA: Diagnosis not present

## 2023-11-01 DIAGNOSIS — R102 Pelvic and perineal pain: Secondary | ICD-10-CM | POA: Insufficient documentation

## 2023-11-01 DIAGNOSIS — Z87442 Personal history of urinary calculi: Secondary | ICD-10-CM

## 2023-11-01 LAB — URINALYSIS, W/ REFLEX TO CULTURE (INFECTION SUSPECTED)
Glucose, UA: NEGATIVE mg/dL
Leukocytes,Ua: NEGATIVE
Nitrite: NEGATIVE
Protein, ur: 30 mg/dL — AB
RBC / HPF: >50 RBC/hpf (ref 0–5)
Specific Gravity, Urine: 1.025 (ref 1.005–1.030)
WBC, UA: NONE SEEN WBC/hpf (ref 0–5)
pH: 6 (ref 5.0–8.0)

## 2023-11-01 MED ORDER — HYDROCODONE-ACETAMINOPHEN 5-325 MG PO TABS: 2.0000 | ORAL_TABLET | ORAL | 0 refills | Status: DC | PRN

## 2023-11-01 MED ORDER — KETOROLAC TROMETHAMINE 60 MG/2ML IM SOLN
30.0000 mg | Freq: Once | INTRAMUSCULAR | Status: AC
  Administered 2023-11-01: 30 mg via INTRAMUSCULAR

## 2023-11-01 MED ORDER — TAMSULOSIN HCL 0.4 MG PO CAPS: 0.4000 mg | ORAL_CAPSULE | Freq: Every day | ORAL | 0 refills | Status: AC

## 2023-11-01 MED ORDER — KETOROLAC TROMETHAMINE 10 MG PO TABS: 10.0000 mg | ORAL_TABLET | Freq: Four times a day (QID) | ORAL | 0 refills | Status: DC | PRN
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
## 2023-11-01 NOTE — ED Triage Notes (Signed)
 Patient presents with c/o dysuria x 2 days and states he has a kidney stone. Denies hematuria.

## 2023-11-01 NOTE — Discharge Instructions (Addendum)
 Follow up with your urologist.  Go to the hospital emergency department, if pain not improving over the next 24 hours.   Stop by the pharmacy to pick up your prescriptions. Do not drive or operate heavy machinery while taking Norco.   General stone prevention strategies including adequate hydration with goal of producing 2.5 L of urine daily, increasing citric acid intake, increasing calcium  intake during high oxalate meals, minimizing animal protein, and decreasing salt intake. Information about dietary recommendations given today.

## 2023-11-04 ENCOUNTER — Ambulatory Visit (HOSPITAL_COMMUNITY): Payer: Self-pay

## 2023-11-14 ENCOUNTER — Ambulatory Visit
Admission: EM | Admit: 2023-11-14 | Discharge: 2023-11-14 | Disposition: A | Discharge status: Home / Self Care | Attending: Physician Assistant | Admitting: Physician Assistant

## 2023-11-14 DIAGNOSIS — K625 Hemorrhage of anus and rectum: Secondary | ICD-10-CM | POA: Diagnosis not present

## 2023-11-14 DIAGNOSIS — K6289 Other specified diseases of anus and rectum: Secondary | ICD-10-CM | POA: Diagnosis not present

## 2023-11-14 DIAGNOSIS — K644 Residual hemorrhoidal skin tags: Secondary | ICD-10-CM | POA: Diagnosis not present

## 2023-11-14 DIAGNOSIS — B372 Candidiasis of skin and nail: Secondary | ICD-10-CM | POA: Diagnosis not present

## 2023-11-14 MED ORDER — HYDROCORTISONE ACETATE 25 MG RE SUPP: 25.0000 mg | Freq: Two times a day (BID) | RECTAL | 0 refills | Status: AC

## 2023-11-14 MED ORDER — NYSTATIN 100000 UNIT/GM EX CREA: TOPICAL_CREAM | CUTANEOUS | 0 refills | Status: DC

## 2023-11-14 NOTE — ED Triage Notes (Signed)
 Patient states that huis hemorrhoid are flaring up . Going on for a while

## 2023-11-14 NOTE — Discharge Instructions (Addendum)
-   You have an external hemorrhoid and possibly internal ones but I was unable to do a exam internally given how uncomfortable you are. - I sent a prescription for Anusol which is a hydrocortisone suppository to use. - If you use topical hydrocortisone cream applied only to your anus do not apply it to any other area. - You have broken out in a bad skin yeast infection, likely from all the diarrhea.  I sent a topical cream called nystatin to the pharmacy for you to use twice daily over the next week or 2 until this clears up.  Continue to practice good hygiene and clean up regularly. - If not improving over the next several days or week please follow-up with your PCP.
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
## 2023-12-30 ENCOUNTER — Other Ambulatory Visit: Payer: Self-pay

## 2023-12-30 ENCOUNTER — Observation Stay
Admission: EM | Admit: 2023-12-30 | Discharge: 2023-12-31 | Disposition: A | Discharge status: Home / Self Care | Attending: Internal Medicine | Admitting: Internal Medicine

## 2023-12-30 DIAGNOSIS — I160 Hypertensive urgency: Secondary | ICD-10-CM | POA: Diagnosis not present

## 2023-12-30 DIAGNOSIS — R252 Cramp and spasm: Secondary | ICD-10-CM

## 2023-12-30 DIAGNOSIS — N179 Acute kidney failure, unspecified: Secondary | ICD-10-CM | POA: Diagnosis not present

## 2023-12-30 DIAGNOSIS — E860 Dehydration: Secondary | ICD-10-CM | POA: Insufficient documentation

## 2023-12-30 DIAGNOSIS — R748 Abnormal levels of other serum enzymes: Secondary | ICD-10-CM

## 2023-12-30 DIAGNOSIS — T675XXA Heat exhaustion, unspecified, initial encounter: Secondary | ICD-10-CM | POA: Insufficient documentation

## 2023-12-30 DIAGNOSIS — R944 Abnormal results of kidney function studies: Secondary | ICD-10-CM | POA: Insufficient documentation

## 2023-12-30 DIAGNOSIS — G894 Chronic pain syndrome: Secondary | ICD-10-CM

## 2023-12-30 DIAGNOSIS — M791 Myalgia, unspecified site: Secondary | ICD-10-CM | POA: Diagnosis present

## 2023-12-30 DIAGNOSIS — F10920 Alcohol use, unspecified with intoxication, uncomplicated: Secondary | ICD-10-CM | POA: Diagnosis not present

## 2023-12-30 DIAGNOSIS — M6282 Rhabdomyolysis: Secondary | ICD-10-CM

## 2023-12-30 DIAGNOSIS — F32A Depression, unspecified: Secondary | ICD-10-CM | POA: Diagnosis not present

## 2023-12-30 DIAGNOSIS — J449 Chronic obstructive pulmonary disease, unspecified: Secondary | ICD-10-CM | POA: Insufficient documentation

## 2023-12-30 DIAGNOSIS — F17210 Nicotine dependence, cigarettes, uncomplicated: Secondary | ICD-10-CM | POA: Insufficient documentation

## 2023-12-30 DIAGNOSIS — Z7982 Long term (current) use of aspirin: Secondary | ICD-10-CM | POA: Insufficient documentation

## 2023-12-30 DIAGNOSIS — K219 Gastro-esophageal reflux disease without esophagitis: Secondary | ICD-10-CM | POA: Diagnosis not present

## 2023-12-30 DIAGNOSIS — N400 Enlarged prostate without lower urinary tract symptoms: Secondary | ICD-10-CM | POA: Insufficient documentation

## 2023-12-30 DIAGNOSIS — E785 Hyperlipidemia, unspecified: Secondary | ICD-10-CM | POA: Diagnosis not present

## 2023-12-30 LAB — BASIC METABOLIC PANEL WITH GFR
Anion gap: 18 — ABNORMAL HIGH (ref 5–15)
Anion gap: 14 (ref 5–15)
Anion gap: 14 (ref 5–15)
Anion gap: 11 (ref 5–15)
Anion gap: 13 (ref 5–15)
BUN: 25 mg/dL — ABNORMAL HIGH (ref 6–20)
BUN: 24 mg/dL — ABNORMAL HIGH (ref 6–20)
BUN: 25 mg/dL — ABNORMAL HIGH (ref 6–20)
BUN: 26 mg/dL — ABNORMAL HIGH (ref 6–20)
BUN: 24 mg/dL — ABNORMAL HIGH (ref 6–20)
CO2: 23 mmol/L (ref 22–32)
CO2: 25 mmol/L (ref 22–32)
CO2: 25 mmol/L (ref 22–32)
CO2: 27 mmol/L (ref 22–32)
CO2: 25 mmol/L (ref 22–32)
Calcium: 10.2 mg/dL (ref 8.9–10.3)
Calcium: 9.5 mg/dL (ref 8.9–10.3)
Calcium: 9.4 mg/dL (ref 8.9–10.3)
Calcium: 9.1 mg/dL (ref 8.9–10.3)
Calcium: 9 mg/dL (ref 8.9–10.3)
Chloride: 99 mmol/L (ref 98–111)
Chloride: 101 mmol/L (ref 98–111)
Chloride: 100 mmol/L (ref 98–111)
Chloride: 100 mmol/L (ref 98–111)
Chloride: 99 mmol/L (ref 98–111)
Creatinine, Ser: 3.12 mg/dL — ABNORMAL HIGH (ref 0.61–1.24)
Creatinine, Ser: 2.62 mg/dL — ABNORMAL HIGH (ref 0.61–1.24)
Creatinine, Ser: 2.05 mg/dL — ABNORMAL HIGH (ref 0.61–1.24)
Creatinine, Ser: 2.14 mg/dL — ABNORMAL HIGH (ref 0.61–1.24)
Creatinine, Ser: 2.04 mg/dL — ABNORMAL HIGH (ref 0.61–1.24)
GFR, Estimated: 23 mL/min — ABNORMAL LOW (ref >60)
GFR, Estimated: 29 mL/min — ABNORMAL LOW (ref >60)
GFR, Estimated: 39 mL/min — ABNORMAL LOW (ref >60)
GFR, Estimated: 37 mL/min — ABNORMAL LOW (ref >60)
GFR, Estimated: 39 mL/min — ABNORMAL LOW (ref >60)
Glucose, Bld: 131 mg/dL — ABNORMAL HIGH (ref 70–99)
Glucose, Bld: 101 mg/dL — ABNORMAL HIGH (ref 70–99)
Glucose, Bld: 117 mg/dL — ABNORMAL HIGH (ref 70–99)
Glucose, Bld: 117 mg/dL — ABNORMAL HIGH (ref 70–99)
Glucose, Bld: 171 mg/dL — ABNORMAL HIGH (ref 70–99)
Potassium: 3.6 mmol/L (ref 3.5–5.1)
Potassium: 3.6 mmol/L (ref 3.5–5.1)
Potassium: 3.6 mmol/L (ref 3.5–5.1)
Potassium: 3.6 mmol/L (ref 3.5–5.1)
Potassium: 3.4 mmol/L — ABNORMAL LOW (ref 3.5–5.1)
Sodium: 140 mmol/L (ref 135–145)
Sodium: 140 mmol/L (ref 135–145)
Sodium: 139 mmol/L (ref 135–145)
Sodium: 138 mmol/L (ref 135–145)
Sodium: 137 mmol/L (ref 135–145)

## 2023-12-30 LAB — CK
Total CK: 684 U/L — ABNORMAL HIGH (ref 49–397)
Total CK: 720 U/L — ABNORMAL HIGH (ref 49–397)
Total CK: 944 U/L — ABNORMAL HIGH (ref 49–397)

## 2023-12-30 LAB — TROPONIN I (HIGH SENSITIVITY)
Troponin I (High Sensitivity): 11 ng/L (ref <18)
Troponin I (High Sensitivity): 9 ng/L (ref <18)

## 2023-12-30 LAB — CBC
HCT: 48.7 % (ref 39.0–52.0)
HCT: 42.5 % (ref 39.0–52.0)
Hemoglobin: 16 g/dL (ref 13.0–17.0)
Hemoglobin: 14.2 g/dL (ref 13.0–17.0)
MCH: 29.7 pg (ref 26.0–34.0)
MCH: 29.7 pg (ref 26.0–34.0)
MCHC: 32.9 g/dL (ref 30.0–36.0)
MCHC: 33.4 g/dL (ref 30.0–36.0)
MCV: 90.4 fL (ref 80.0–100.0)
MCV: 88.9 fL (ref 80.0–100.0)
Platelets: 236 K/uL (ref 150–400)
Platelets: 192 K/uL (ref 150–400)
RBC: 5.39 MIL/uL (ref 4.22–5.81)
RBC: 4.78 MIL/uL (ref 4.22–5.81)
RDW: 14.1 % (ref 11.5–15.5)
RDW: 14 % (ref 11.5–15.5)
WBC: 13.2 K/uL — ABNORMAL HIGH (ref 4.0–10.5)
WBC: 10.7 K/uL — ABNORMAL HIGH (ref 4.0–10.5)
nRBC: 0 % (ref 0.0–0.2)
nRBC: 0 % (ref 0.0–0.2)

## 2023-12-30 LAB — MAGNESIUM: Magnesium: 2.2 mg/dL (ref 1.7–2.4)

## 2023-12-30 LAB — HIV ANTIBODY (ROUTINE TESTING W REFLEX): HIV Screen 4th Generation wRfx: NONREACTIVE

## 2023-12-30 MED ORDER — ACETAMINOPHEN 650 MG RE SUPP: 650.0000 mg | Freq: Four times a day (QID) | RECTAL | Status: DC | PRN

## 2023-12-30 MED ORDER — TAMSULOSIN HCL 0.4 MG PO CAPS
0.4000 mg | ORAL_CAPSULE | Freq: Every day | ORAL | Status: DC
  Administered 2023-12-30 – 2023-12-31 (×2): 0.4 mg via ORAL
  Filled 2023-12-30 (×2): qty 1

## 2023-12-30 MED ORDER — HYDROCORTISONE ACETATE 25 MG RE SUPP: 25.0000 mg | Freq: Two times a day (BID) | RECTAL | Status: DC | PRN

## 2023-12-30 MED ORDER — ENOXAPARIN SODIUM 40 MG/0.4ML IJ SOSY
40.0000 mg | PREFILLED_SYRINGE | INTRAMUSCULAR | Status: DC
  Administered 2023-12-30 – 2023-12-31 (×2): 40 mg via SUBCUTANEOUS
  Filled 2023-12-30 (×2): qty 0.4

## 2023-12-30 MED ORDER — BACLOFEN 10 MG PO TABS
10.0000 mg | ORAL_TABLET | Freq: Every day | ORAL | Status: DC
  Administered 2023-12-30 – 2023-12-31 (×2): 10 mg via ORAL
  Filled 2023-12-30 (×2): qty 1

## 2023-12-30 MED ORDER — PANTOPRAZOLE SODIUM 40 MG PO TBEC
40.0000 mg | DELAYED_RELEASE_TABLET | Freq: Every day | ORAL | Status: DC
  Administered 2023-12-30 – 2023-12-31 (×2): 40 mg via ORAL
  Filled 2023-12-30 (×2): qty 1

## 2023-12-30 MED ORDER — MORPHINE SULFATE (PF) 2 MG/ML IV SOLN
2.0000 mg | INTRAVENOUS | Status: DC | PRN
  Administered 2023-12-30 – 2023-12-31 (×3): 2 mg via INTRAVENOUS
  Filled 2023-12-30 (×3): qty 1

## 2023-12-30 MED ORDER — METOPROLOL SUCCINATE ER 25 MG PO TB24
25.0000 mg | ORAL_TABLET | Freq: Every day | ORAL | Status: DC
  Administered 2023-12-30 – 2023-12-31 (×2): 25 mg via ORAL
  Filled 2023-12-30 (×2): qty 1

## 2023-12-30 MED ORDER — LACTATED RINGERS IV BOLUS
1000.0000 mL | Freq: Once | INTRAVENOUS | Status: AC
  Administered 2023-12-30: 1000 mL via INTRAVENOUS

## 2023-12-30 MED ORDER — BLISTEX MEDICATED EX OINT
TOPICAL_OINTMENT | CUTANEOUS | Status: DC | PRN
  Filled 2023-12-30: qty 6.3

## 2023-12-30 MED ORDER — ASPIRIN 81 MG PO TBEC
81.0000 mg | DELAYED_RELEASE_TABLET | Freq: Every day | ORAL | Status: DC
  Administered 2023-12-30 – 2023-12-31 (×2): 81 mg via ORAL
  Filled 2023-12-30 (×2): qty 1

## 2023-12-30 MED ORDER — HYDROCORTISONE ACETATE 25 MG RE SUPP: 25.0000 mg | Freq: Two times a day (BID) | RECTAL | Status: DC

## 2023-12-30 MED ORDER — ONDANSETRON HCL 4 MG PO TABS: 4.0000 mg | ORAL_TABLET | Freq: Four times a day (QID) | ORAL | Status: DC | PRN

## 2023-12-30 MED ORDER — ATORVASTATIN CALCIUM 80 MG PO TABS: 80.0000 mg | ORAL_TABLET | Freq: Every day | ORAL | Status: DC

## 2023-12-30 MED ORDER — SODIUM CHLORIDE 0.9 % IV BOLUS
1000.0000 mL | Freq: Once | INTRAVENOUS | Status: AC
  Administered 2023-12-30: 1000 mL via INTRAVENOUS

## 2023-12-30 MED ORDER — ALBUTEROL SULFATE (2.5 MG/3ML) 0.083% IN NEBU: 3.0000 mL | INHALATION_SOLUTION | Freq: Four times a day (QID) | RESPIRATORY_TRACT | Status: DC | PRN

## 2023-12-30 MED ORDER — AMLODIPINE BESYLATE 5 MG PO TABS
5.0000 mg | ORAL_TABLET | Freq: Every day | ORAL | Status: DC
  Administered 2023-12-30 – 2023-12-31 (×2): 5 mg via ORAL
  Filled 2023-12-30 (×2): qty 1

## 2023-12-30 MED ORDER — NYSTATIN 100000 UNIT/GM EX CREA
TOPICAL_CREAM | Freq: Two times a day (BID) | CUTANEOUS | Status: DC
  Filled 2023-12-30: qty 30

## 2023-12-30 MED ORDER — HYDRALAZINE HCL 20 MG/ML IJ SOLN
10.0000 mg | Freq: Four times a day (QID) | INTRAMUSCULAR | Status: DC | PRN
  Administered 2023-12-30: 10 mg via INTRAVENOUS
  Filled 2023-12-30: qty 1

## 2023-12-30 MED ORDER — LACTATED RINGERS IV BOLUS: 1000.0000 mL | Freq: Once | INTRAVENOUS | Status: DC

## 2023-12-30 MED ORDER — ORAL CARE MOUTH RINSE: 15.0000 mL | OROMUCOSAL | Status: DC | PRN

## 2023-12-30 MED ORDER — MORPHINE SULFATE (PF) 4 MG/ML IV SOLN
4.0000 mg | Freq: Once | INTRAVENOUS | Status: AC
  Administered 2023-12-30: 4 mg via INTRAVENOUS
  Filled 2023-12-30: qty 1

## 2023-12-30 MED ORDER — PREGABALIN 50 MG PO CAPS
200.0000 mg | ORAL_CAPSULE | Freq: Three times a day (TID) | ORAL | Status: DC
  Administered 2023-12-30 – 2023-12-31 (×5): 200 mg via ORAL
  Filled 2023-12-30 (×5): qty 4

## 2023-12-30 MED ORDER — DIVALPROEX SODIUM ER 500 MG PO TB24
1000.0000 mg | ORAL_TABLET | Freq: Every day | ORAL | Status: DC
  Administered 2023-12-30: 1000 mg via ORAL
  Filled 2023-12-30 (×2): qty 2

## 2023-12-30 MED ORDER — SODIUM CHLORIDE 0.9 % IV SOLN: INTRAVENOUS | Status: DC

## 2023-12-30 MED ORDER — QUETIAPINE FUMARATE 100 MG PO TABS
100.0000 mg | ORAL_TABLET | Freq: Every day | ORAL | Status: DC
  Administered 2023-12-30: 100 mg via ORAL
  Filled 2023-12-30: qty 1
  Filled 2023-12-30: qty 4
  Filled 2023-12-30: qty 1

## 2023-12-30 MED ORDER — ONDANSETRON HCL 4 MG/2ML IJ SOLN: 4.0000 mg | Freq: Four times a day (QID) | INTRAMUSCULAR | Status: DC | PRN

## 2023-12-30 MED ORDER — BUPROPION HCL ER (XL) 150 MG PO TB24
450.0000 mg | ORAL_TABLET | Freq: Every morning | ORAL | Status: DC
  Administered 2023-12-30 – 2023-12-31 (×2): 450 mg via ORAL
  Filled 2023-12-30 (×2): qty 3

## 2023-12-30 MED ORDER — NITROGLYCERIN 0.4 MG SL SUBL: 0.4000 mg | SUBLINGUAL_TABLET | SUBLINGUAL | Status: DC | PRN

## 2023-12-30 MED ORDER — HYDROCODONE-ACETAMINOPHEN 5-325 MG PO TABS
2.0000 | ORAL_TABLET | ORAL | Status: DC | PRN
  Administered 2023-12-30 (×3): 2 via ORAL
  Filled 2023-12-30 (×3): qty 2

## 2023-12-30 MED ORDER — MIDAZOLAM HCL 2 MG/2ML IJ SOLN
2.0000 mg | Freq: Once | INTRAMUSCULAR | Status: AC
  Administered 2023-12-30: 2 mg via INTRAVENOUS
  Filled 2023-12-30: qty 2

## 2023-12-30 MED ORDER — ACETAMINOPHEN 325 MG PO TABS
650.0000 mg | ORAL_TABLET | Freq: Four times a day (QID) | ORAL | Status: DC | PRN
  Administered 2023-12-30: 650 mg via ORAL
  Filled 2023-12-30: qty 2

## 2023-12-30 MED ORDER — SERTRALINE HCL 100 MG PO TABS
200.0000 mg | ORAL_TABLET | Freq: Every day | ORAL | Status: DC
  Administered 2023-12-30 – 2023-12-31 (×2): 200 mg via ORAL
  Filled 2023-12-30 (×2): qty 2
  Filled 2023-12-30 (×2): qty 4

## 2023-12-30 MED ORDER — MAGNESIUM HYDROXIDE 400 MG/5ML PO SUSP: 30.0000 mL | Freq: Every day | ORAL | Status: DC | PRN

## 2023-12-30 MED ORDER — MIRTAZAPINE 15 MG PO TABS
15.0000 mg | ORAL_TABLET | Freq: Every day | ORAL | Status: DC
  Administered 2023-12-30: 15 mg via ORAL
  Filled 2023-12-30: qty 1

## 2023-12-30 MED ORDER — PRIMIDONE 50 MG PO TABS
50.0000 mg | ORAL_TABLET | Freq: Two times a day (BID) | ORAL | Status: DC
  Administered 2023-12-30 – 2023-12-31 (×3): 50 mg via ORAL
  Filled 2023-12-30 (×4): qty 1

## 2023-12-30 MED ORDER — TRAZODONE HCL 50 MG PO TABS
25.0000 mg | ORAL_TABLET | Freq: Every evening | ORAL | Status: DC | PRN
  Administered 2023-12-30: 25 mg via ORAL
  Filled 2023-12-30: qty 1

## 2023-12-30 MED ORDER — NALOXONE HCL 4 MG/0.1ML NA LIQD: 1.0000 | NASAL | Status: DC | PRN

## 2023-12-30 MED ORDER — PRAZOSIN HCL 2 MG PO CAPS
2.0000 mg | ORAL_CAPSULE | Freq: Every day | ORAL | Status: DC
  Administered 2023-12-30: 2 mg via ORAL
  Filled 2023-12-30 (×2): qty 1

## 2023-12-30 MED ORDER — SODIUM CHLORIDE 0.9 % IV BOLUS: 1000.0000 mL | Freq: Once | INTRAVENOUS | Status: DC

## 2023-12-30 MED ORDER — CYCLOBENZAPRINE HCL 10 MG PO TABS
5.0000 mg | ORAL_TABLET | Freq: Three times a day (TID) | ORAL | Status: DC | PRN
  Administered 2023-12-30: 5 mg via ORAL
  Filled 2023-12-30: qty 1

## 2023-12-30 MED ORDER — LABETALOL HCL 5 MG/ML IV SOLN: 20.0000 mg | INTRAVENOUS | Status: DC | PRN

## 2023-12-30 NOTE — Assessment & Plan Note (Signed)
-   Will continue antihypertensive therapy. - The patient was placed on as needed IV labetalol  and hydralazine  as well.

## 2023-12-30 NOTE — Assessment & Plan Note (Signed)
-   This is likely prerenal secondary to volume depletion and dehydration. - The patient will be admitted to the medical telemetry bed. - Will continue hydration with IV normal saline. - Will follow BMPs. - Will follow CK levels.

## 2023-12-30 NOTE — Plan of Care (Signed)

## 2023-12-30 NOTE — ED Provider Notes (Signed)
 [facility] Provider Note    Event Date/Time   First MD Initiated Contact with Patient 12/30/23 0100     (approximate)   History   Muscle Pain   HPI Jeffery Hubbard is a 48 y.o. male who presents by EMS for evaluation of generalized pain and muscle cramping.  He has a history of chronic back pain and sees a pain management doctor at [facility].  However, the issue over the last couple of days seems to be that he has been working out in his garage or auto shop in the heat.  He said he feels lightheaded and dizzy and after all day yesterday working in the heat he was cramping last night.  It was little bit better today but then much worse by tonight.  He said that his arms and his legs and all throughout his body feels like they are constantly squeezing and cramping and he cannot get any relief.  He feels like his heart is racing.  No specific chest pain or shortness of breath.  No recent injury.     Physical Exam   Triage Vital Signs: ED Triage Vitals  Encounter Vitals Group     BP 12/30/23 0100 (!) 145/110     Girls Systolic BP Percentile --      Girls Diastolic BP Percentile --      Boys Systolic BP Percentile --      Boys Diastolic BP Percentile --      Pulse Rate 12/30/23 0100 (!) 116     Resp 12/30/23 0100 20     Temp 12/30/23 0100 98 F (36.7 C)     Temp Source 12/30/23 0100 Oral     SpO2 12/30/23 0100 100 %     Weight --      Height 12/30/23 0101 1.727 m (5' 8)     Head Circumference --      Peak Flow --      Pain Score 12/30/23 0100 10     Pain Loc --      Pain Education --      Exclude from Growth Chart --     Most recent vital signs: Vitals:   12/30/23 0100 12/30/23 0130  BP: (!) 145/110 (!) 159/129  Pulse: (!) 116 (!) 120  Resp: 20 17  Temp: 98 F (36.7 C)   SpO2: 100% 97%    General: Awake,  Appears very uncomfortable. CV:  Good peripheral perfusion.  Tachycardia, regular rhythm. Resp:  Normal effort. Speaking easily and  comfortably, no accessory muscle usage nor intercostal retractions.  Patient has tachypnea that seems to be due to his discomfort. Abd:  No distention.  No tenderness to palpation. Other:  Patient is continually moving around and trying to rub his arms and his legs.  His major muscle groups in his arms and his legs do not seem to be cramped or flexed all the time, but they do seem to be intermittently flexing and squeezing.   ED Results / Procedures / Treatments   Labs (all labs ordered are listed, but only abnormal results are displayed) Labs Reviewed  CBC - Abnormal; Notable for the following components:      Result Value   WBC 13.2 (*)    All other components within normal limits  BASIC METABOLIC PANEL WITH GFR  CK  MAGNESIUM   TROPONIN I (HIGH SENSITIVITY)     EKG  ED ECG REPORT I, Darleene Dome, the attending physician, personally viewed and  interpreted this ECG.  Date: 12/30/2023 EKG Time: 1 AM Rate: 121 Rhythm: Sinus tach QRS Axis: RAD Intervals: normal ST/T Wave abnormalities: normal Narrative Interpretation: no evidence of acute ischemia      PROCEDURES:  Critical Care performed: No  Procedures    IMPRESSION / MDM / ASSESSMENT AND PLAN / ED COURSE  I reviewed the triage vital signs and the nursing notes.                              Differential diagnosis includes, but is not limited to, rhabdomyolysis, electrolyte deficiency, acute renal failure.  Patient's presentation is most consistent with acute presentation with potential threat to life or bodily function.  Labs/studies ordered: Magnesium , CK, CBC, high-sensitivity troponin, basic metabolic panel, EKG  Interventions/Medications given:  Medications  lactated ringers  bolus 1,000 mL (has no administration in time range)     (Note:  hospital course my include additional interventions and/or labs/studies not listed above.)   Patient initially tachycardic in the 120 to 140 range, still  hanging out at about 120 despite about 500 mL of normal saline by EMS.  I checked the [place]  controlled substance database and verified in the medical record that he sees the pain management doctor at [facility], he last had a clinic appointment about a month ago.  It looks like he does not regularly get prescriptions for opioids.  Given the muscle cramping, I will try Versed  2 mg IV as well as a liter of normal saline while awaiting laboratory results.  Patient agrees with the plan   Clinical Course as of 12/30/23 0412  Mon Dec 30, 2023  0148 CK Total(!): 684 slightly though not severely elevated  [CF]  0150 Basic metabolic panel(!) Acute renal failure/acute kidney injury with creatinine 3.12.  I will repeat after the fluid bolus to see if he is showing improvement along with a repeat CK [CF]  0307 Patient slept initially after the Versed  but now is reporting more pain, and more on the right side of his body.  I ordered morphine  4 mg IV [CF]  0331 CK Total(!): 720 CK is going up slightly. [CF]  [phone] Basic metabolic panel(!) Metabolic panel shows improvement but still has a creatinine of 2.6 which qualifies for acute renal failure.  I will discuss with the patient but anticipate admission for continued IV hydration [CF]  [phone] Patient still cramping and having severe pain, ordered another 4 mg of morphine .  He is reporting right-sided abdominal pain and I palpated his abdomen but he has tense muscles throughout, this does not represent a peritoneal abdomen [CF]  0337 I am consulting the hospitalist team for admission.   [CF]  [phone] I consulted by phone with the admitting hospitalist, and they will admit the patient - Dr. Lawence. [CF]    Clinical Course User Index [CF] Gordan Huxley, MD     FINAL CLINICAL IMPRESSION(S) / ED DIAGNOSES   Final diagnoses:  Acute renal failure, unspecified acute renal failure type (HCC)  Dehydration  Muscle cramps  Elevated CK  Chronic pain syndrome      Rx / DC Orders   ED Discharge Orders     None        Note:  This document was prepared using Dragon voice recognition software and may include unintentional dictation errors.   Gordan Huxley, MD 12/30/23 [phone]
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
## 2023-12-30 NOTE — Progress Notes (Signed)
  Progress Note   Patient: Jeffery Hubbard FMW:[id number] DOB: 06-28-1975 DOA: 12/30/2023     0 DOS: the patient was seen and examined on 12/30/2023   Brief hospital course: YORK VALLIANT is a 48 y.o. Caucasian male with medical history significant for COPD, depression, GERD, coronary artery disease, dyslipidemia, urolithiasis, OSA on CPAP, who presented to the emergency room with acute onset of myalgia with fatigue and dizziness as well as nausea without vomiting.  He also had some loose stools.  He has been working on a car in his garage for 2 days.  Upon arriving to hospital, he diagnosed with heat exhaustion, acute kidney failure, rhabdomyolysis.  Patient has been treated with IV fluids.   Principal Problem:   AKI (acute kidney injury) (HCC) Active Problems:   Rhabdomyolysis   Dyslipidemia   Depression   GERD without esophagitis   Hypertensive urgency   BPH (benign prostatic hyperplasia)   Assessment and Plan: * AKI (acute kidney injury) (HCC) secondary to dehydration. Rhabdomyolysis secondary to heat exhaustion. Heat exhaustion. Will continue IV fluids, monitor CK level and renal function.  Renal function already improving.  Patient still has not made much urine.  Volume overload.  Dyslipidemia - Will hold off statin therapy given his rhabdomyolysis.  Depression Will continue Zoloft  and Seroquel .  BPH (benign prostatic hyperplasia) - Will continue Flomax .  Hypertensive urgency - Will continue antihypertensive therapy. - The patient was placed on as needed IV labetalol  and hydralazine  as well.  GERD without esophagitis - Will continue PPI therapy.       Subjective:  Patient has significant muscle spasms, had a loose stools yesterday, no bowel movement since admission.  Physical Exam: Vitals:   12/30/23 0300 12/30/23 0430 12/30/23 0513 12/30/23 0756  BP: (!) 149/97 (!) 154/100 (!) 132/109 (!) 131/93  Pulse: 94 89 (!) 103 (!) 103  Resp: 18 20 20 17   Temp:   97.7 F  (36.5 C) (!) 97.5 F (36.4 C)  TempSrc:   Oral Oral  SpO2: 98% 99% 96% 94%  Weight:   89 kg   Height:   5' 8 (1.727 m)    General exam: Appears calm and comfortable  Respiratory system: Clear to auscultation. Respiratory effort normal. Cardiovascular system: S1 & S2 heard, RRR. No JVD, murmurs, rubs, gallops or clicks. No pedal edema. Gastrointestinal system: Abdomen is nondistended, soft and nontender. No organomegaly or masses felt. Normal bowel sounds heard. Central nervous system: Alert and oriented. No focal neurological deficits. Extremities: Symmetric 5 x 5 power. Skin: No rashes, lesions or ulcers Psychiatry: Judgement and insight appear normal. Mood & affect appropriate.    Data Reviewed:  Lab results reviewed.  Family Communication: None  Disposition: Status is: Inpatient Remains inpatient appropriate because: Severity of disease, IV treatment.     Time spent: no charge minutes  Author: Murvin Mana, MD 12/30/2023 10:35 AM  For on call review [email or web address].

## 2023-12-30 NOTE — Hospital Course (Signed)
 Jeffery Hubbard is a 48 y.o. Caucasian male with medical history significant for COPD, depression, GERD, coronary artery disease, dyslipidemia, urolithiasis, OSA on CPAP, who presented to the emergency room with acute onset of myalgia with fatigue and dizziness as well as nausea without vomiting.  He also had some loose stools.  He has been working on a car in his garage for 2 days.  Upon arriving to hospital, he diagnosed with heat exhaustion, acute kidney failure, rhabdomyolysis.  Patient has been treated with IV fluids.  Condition has improved, renal function normalized.  CK level has came down.  Patient is medically stable for discharge

## 2023-12-30 NOTE — Assessment & Plan Note (Addendum)
 Will continue Zoloft  and Seroquel .

## 2023-12-30 NOTE — Assessment & Plan Note (Addendum)
-   Will hold off statin therapy given his rhabdomyolysis.

## 2023-12-30 NOTE — ED Triage Notes (Signed)
 BIB [facility] from home. Pt called EMS for pain. Pt was ambulatory on scene. Pt has had muscle cramps since last night. Pt hasnt had much to eat or drink but has been working ion the [employer]. EKG normal. 130 HR initially  140/82 BGL 76 100% RA  4 MG zofran  500 of NS 7.5 G of oral glucose RAC 18 G

## 2023-12-30 NOTE — Assessment & Plan Note (Signed)
Will continue Flomax.

## 2023-12-30 NOTE — Assessment & Plan Note (Addendum)
-   If aggressively hydrated with IV normal saline will follow CK levels. - Muscle relaxants and pain management will be provided for muscle cramps and pain.

## 2023-12-30 NOTE — Assessment & Plan Note (Signed)
 Will continue PPI therapy.

## 2023-12-30 NOTE — ED Notes (Signed)
Full rainbow sent down

## 2023-12-30 NOTE — Assessment & Plan Note (Signed)
Will continue antihypertensive therapy.

## 2023-12-31 DIAGNOSIS — T796XXA Traumatic ischemia of muscle, initial encounter: Secondary | ICD-10-CM | POA: Diagnosis not present

## 2023-12-31 DIAGNOSIS — N179 Acute kidney failure, unspecified: Secondary | ICD-10-CM | POA: Diagnosis not present

## 2023-12-31 DIAGNOSIS — I160 Hypertensive urgency: Secondary | ICD-10-CM | POA: Diagnosis not present

## 2023-12-31 LAB — BASIC METABOLIC PANEL WITH GFR
Anion gap: 8 (ref 5–15)
Anion gap: 5 (ref 5–15)
BUN: 18 mg/dL (ref 6–20)
BUN: 14 mg/dL (ref 6–20)
CO2: 28 mmol/L (ref 22–32)
CO2: 27 mmol/L (ref 22–32)
Calcium: 8.6 mg/dL — ABNORMAL LOW (ref 8.9–10.3)
Calcium: 8.2 mg/dL — ABNORMAL LOW (ref 8.9–10.3)
Chloride: 104 mmol/L (ref 98–111)
Chloride: 108 mmol/L (ref 98–111)
Creatinine, Ser: 1.21 mg/dL (ref 0.61–1.24)
Creatinine, Ser: 0.93 mg/dL (ref 0.61–1.24)
GFR, Estimated: >60 mL/min (ref >60)
GFR, Estimated: >60 mL/min (ref >60)
Glucose, Bld: 100 mg/dL — ABNORMAL HIGH (ref 70–99)
Glucose, Bld: 91 mg/dL (ref 70–99)
Potassium: 3.6 mmol/L (ref 3.5–5.1)
Potassium: 4.6 mmol/L (ref 3.5–5.1)
Sodium: 140 mmol/L (ref 135–145)
Sodium: 140 mmol/L (ref 135–145)

## 2023-12-31 LAB — CK
Total CK: 1196 U/L — ABNORMAL HIGH (ref 49–397)
Total CK: 801 U/L — ABNORMAL HIGH (ref 49–397)

## 2023-12-31 LAB — MAGNESIUM: Magnesium: 2.1 mg/dL (ref 1.7–2.4)

## 2023-12-31 MED ORDER — POTASSIUM CHLORIDE IN NACL 20-0.9 MEQ/L-% IV SOLN
INTRAVENOUS | Status: DC
  Filled 2023-12-31 (×3): qty 1000

## 2023-12-31 NOTE — Discharge Summary (Signed)
 Physician Discharge Summary   Patient: Jeffery Hubbard MRN: [id number] DOB: 28-Apr-1976  Admit date:     12/30/2023  Discharge date: 12/31/23  Discharge Physician: Murvin Mana   PCP: Jeffie Cheryl BRAVO, MD   Recommendations at discharge:   PCP in 1 week.  Discharge Diagnoses: Principal Problem:   AKI (acute kidney injury) (HCC) Active Problems:   Rhabdomyolysis   Dyslipidemia   Depression   GERD without esophagitis   Hypertensive urgency   BPH (benign prostatic hyperplasia)  Resolved Problems:   * No resolved hospital problems. *  Hospital Course: Jeffery Hubbard is a 48 y.o. Caucasian male with medical history significant for COPD, depression, GERD, coronary artery disease, dyslipidemia, urolithiasis, OSA on CPAP, who presented to the emergency room with acute onset of myalgia with fatigue and dizziness as well as nausea without vomiting.  He also had some loose stools.  He has been working on a car in his garage for 2 days.  Upon arriving to hospital, he diagnosed with heat exhaustion, acute kidney failure, rhabdomyolysis.  Patient has been treated with IV fluids.  Condition has improved, renal function normalized.  CK level has came down.  Patient is medically stable for discharge  Assessment and Plan: * AKI (acute kidney injury) (HCC) secondary to dehydration. Rhabdomyolysis secondary to heat exhaustion. Heat exhaustion. Continue treated with IV fluids, renal function has finally normalized since this morning.  CK level also dropped down to 800s.  Symptom improved.  Patient is medically stable for discharge.   Dyslipidemia Home statin has rhabdomyolysis was traumatic from heat exhaustion.   Depression Will continue Zoloft  and Seroquel .   BPH (benign prostatic hyperplasia) - Will continue Flomax .   Hypertensive urgency - Will continue antihypertensive therapy. - The patient was placed on as needed IV labetalol  and hydralazine  as well.   GERD without esophagitis - Will  continue PPI therapy.        Consultants: none Procedures performed: None  Disposition: Home Diet recommendation:  Discharge Diet Orders (From admission, onward)     Start     Ordered   12/31/23 0000  Diet - low sodium heart healthy        12/31/23 1509           Cardiac diet DISCHARGE MEDICATION: Allergies as of 12/31/2023       Reactions   Opana [oxymorphone] Itching   Topamax [topiramate] Other (See Comments)   Severe headaches.        Medication List     STOP taking these medications    HYDROcodone -acetaminophen  5-325 MG tablet Commonly known as: NORCO/VICODIN   ketorolac  10 MG tablet Commonly known as: TORADOL    naloxone  4 MG/0.1ML Liqd nasal spray kit Commonly known as: NARCAN    nystatin  cream Commonly known as: MYCOSTATIN        TAKE these medications    amLODipine  5 MG tablet Commonly known as: NORVASC  Take 5 mg by mouth daily.   aspirin  EC 81 MG tablet Take 1 tablet (81 mg total) by mouth daily. Swallow whole.   atorvastatin  80 MG tablet Commonly known as: LIPITOR Take 1 tablet (80 mg total) by mouth daily.   baclofen  10 MG tablet Commonly known as: LIORESAL  Take 1 tablet (10 mg total) by mouth daily.   buPROPion  HCl ER (XL) 450 MG Tb24 Take 1 tablet (450 mg total) by mouth every morning.   divalproex  500 MG 24 hr tablet Commonly known as: DEPAKOTE  ER Take 2 tablets (1,000 mg total) by mouth  at bedtime.   hydrocortisone  25 MG suppository Commonly known as: ANUSOL -HC Place 1 suppository (25 mg total) rectally 2 (two) times daily.   metoprolol  succinate 25 MG 24 hr tablet Commonly known as: TOPROL -XL Take 25 mg by mouth daily.   mirtazapine  15 MG tablet Commonly known as: REMERON  TAKE 1 TABLET BY MOUTH AT BEDTIME   nitroGLYCERIN  0.4 MG SL tablet Commonly known as: NITROSTAT  Place 0.4 mg under the tongue every 5 (five) minutes as needed for chest pain.   pantoprazole  40 MG tablet Commonly known as: PROTONIX  Take 1  tablet (40 mg total) by mouth daily before breakfast.   prazosin  2 MG capsule Commonly known as: MINIPRESS  Take 1 capsule (2 mg total) by mouth at bedtime.   pregabalin  200 MG capsule Commonly known as: LYRICA  Take 1 capsule (200 mg total) by mouth 3 (three) times daily.   primidone  50 MG tablet Commonly known as: MYSOLINE  Take 50 mg by mouth 2 (two) times daily.   QUEtiapine  100 MG tablet Commonly known as: SEROQUEL  Take 100 mg by mouth as directed.   sertraline  100 MG tablet Commonly known as: ZOLOFT  Take 2 tablets (200 mg total) by mouth daily. Keep on file   tamsulosin  0.4 MG Caps capsule Commonly known as: FLOMAX  Take 1 capsule (0.4 mg total) by mouth daily.   Ventolin  HFA 108 (90 Base) MCG/ACT inhaler Generic drug: albuterol  Inhale 2 puffs into the lungs every 6 (six) hours as needed. For shortness of breath/wheezing.        Follow-up Information     Jeffie Cheryl BRAVO, MD Follow up in 1 week(s).   Specialty: Family Medicine Contact information: [address] [place] [address] [phone]                Discharge Exam: Filed Weights   12/30/23 0513  Weight: 89 kg   General exam: Appears calm and comfortable  Respiratory system: Clear to auscultation. Respiratory effort normal. Cardiovascular system: S1 & S2 heard, RRR. No JVD, murmurs, rubs, gallops or clicks. No pedal edema. Gastrointestinal system: Abdomen is nondistended, soft and nontender. No organomegaly or masses felt. Normal bowel sounds heard. Central nervous system: Alert and oriented. No focal neurological deficits. Extremities: Symmetric 5 x 5 power. Skin: No rashes, lesions or ulcers Psychiatry: Judgement and insight appear normal. Mood & affect appropriate.    Condition at discharge: good  The results of significant diagnostics from this hospitalization (including imaging, microbiology, ancillary and laboratory) are listed below for reference.   Imaging Studies: No  results found.  Microbiology: Results for orders placed or performed in visit on 04/11/23  Microscopic Examination     Status: Abnormal   Collection Time: 04/11/23  1:02 PM   Urine  Result Value Ref Range Status   WBC, UA 0-5 0 - 5 /hpf Final   RBC, Urine 0-2 0 - 2 /hpf Final   Epithelial Cells (non renal) 0-10 0 - 10 /hpf Final   Mucus, UA Present (A) Not Estab. Final   Bacteria, UA Few None seen/Few Final    Labs: CBC: Recent Labs  Lab 12/30/23 0050 12/30/23 0540  WBC 13.2* 10.7*  HGB 16.0 14.2  HCT 48.7 42.5  MCV 90.4 88.9  PLT 236 192   Basic Metabolic Panel: Recent Labs  Lab 12/30/23 0050 12/30/23 0259 12/30/23 0540 12/30/23 0833 12/30/23 1047 12/31/23 0312 12/31/23 1356  NA 140   < > 139 138 137 140 140  K 3.6   < > 3.6  3.6 3.4* 3.6 4.6  CL 99   < > 100 100 99 104 108  CO2 23   < > 25 27 25 28 27   GLUCOSE 131*   < > 117* 117* 171* 100* 91  BUN 25*   < > 25* 26* 24* 18 14  CREATININE 3.12*   < > 2.05* 2.14* 2.04* 1.21 0.93  CALCIUM  10.2   < > 9.4 9.1 9.0 8.6* 8.2*  MG 2.2  --   --   --   --  2.1  --    < > = values in this interval not displayed.   Liver Function Tests: No results for input(s): AST, ALT, ALKPHOS, BILITOT, PROT, ALBUMIN in the last 168 hours. CBG: No results for input(s): GLUCAP in the last 168 hours.  Discharge time spent: greater than 30 minutes.  Signed: Murvin Mana, MD [facility] 12/31/2023

## 2023-12-31 NOTE — Care Management CC44 (Signed)
 Condition Code 44 Documentation Completed  Patient Details  Name: Jeffery Hubbard MRN: [id number] Date of Birth: September 29, 1975   Condition Code 44 given:  Yes Patient signature on Condition Code 44 notice:  Yes Documentation of 2 MD's agreement:  Yes Code 44 added to claim:  Yes    Delphine KANDICE Bring, RN 12/31/2023, 4:00 PM

## 2023-12-31 NOTE — Care Management Obs Status (Signed)
 MEDICARE OBSERVATION STATUS NOTIFICATION   Patient Details  Name: Jeffery Hubbard MRN: [id number] Date of Birth: 1975-11-13   Medicare Observation Status Notification Given:  Yes    Delphine KANDICE Bring, RN 12/31/2023, 4:00 PM
